# Patient Record
Sex: Male | Born: 1965 | Race: Black or African American | Hispanic: No | Marital: Single | Smoking: Never smoker
Health system: Southern US, Community
[De-identification: ages and names within clinical notes are randomized; demographics above are authoritative.]

## PROBLEM LIST (undated history)

## (undated) DIAGNOSIS — I472 Ventricular tachycardia, unspecified: Secondary | ICD-10-CM

## (undated) DIAGNOSIS — I428 Other cardiomyopathies: Secondary | ICD-10-CM

## (undated) DIAGNOSIS — F1010 Alcohol abuse, uncomplicated: Secondary | ICD-10-CM

## (undated) DIAGNOSIS — I447 Left bundle-branch block, unspecified: Secondary | ICD-10-CM

## (undated) DIAGNOSIS — R55 Syncope and collapse: Secondary | ICD-10-CM

## (undated) DIAGNOSIS — I509 Heart failure, unspecified: Secondary | ICD-10-CM

## (undated) HISTORY — PX: OTHER SURGICAL HISTORY: SHX169

## (undated) HISTORY — DX: Alcohol abuse, uncomplicated: F10.10

## (undated) HISTORY — PX: IMPLANTABLE CARDIOVERTER DEFIBRILLATOR IMPLANT: SHX5860

## (undated) HISTORY — DX: Other cardiomyopathies: I42.8

## (undated) HISTORY — DX: Syncope and collapse: R55

## (undated) HISTORY — PX: PACEMAKER PLACEMENT: SHX43

## (undated) HISTORY — DX: Heart failure, unspecified: I50.9

## (undated) HISTORY — DX: Ventricular tachycardia: I47.2

## (undated) HISTORY — DX: Ventricular tachycardia, unspecified: I47.20

## (undated) HISTORY — DX: Left bundle-branch block, unspecified: I44.7

---

## 1999-05-23 ENCOUNTER — Encounter: Admission: RE | Admit: 1999-05-23 | Discharge: 1999-05-23 | Payer: Self-pay | Admitting: Internal Medicine

## 2000-10-31 ENCOUNTER — Emergency Department (HOSPITAL_COMMUNITY): Admission: EM | Admit: 2000-10-31 | Discharge: 2000-10-31 | Payer: Self-pay | Admitting: Emergency Medicine

## 2000-10-31 ENCOUNTER — Encounter: Payer: Self-pay | Admitting: Emergency Medicine

## 2000-11-08 ENCOUNTER — Ambulatory Visit (HOSPITAL_BASED_OUTPATIENT_CLINIC_OR_DEPARTMENT_OTHER): Admission: RE | Admit: 2000-11-08 | Discharge: 2000-11-08 | Payer: Self-pay | Admitting: Orthopedic Surgery

## 2000-11-12 ENCOUNTER — Ambulatory Visit (HOSPITAL_BASED_OUTPATIENT_CLINIC_OR_DEPARTMENT_OTHER): Admission: RE | Admit: 2000-11-12 | Discharge: 2000-11-12 | Payer: Self-pay | Admitting: Otolaryngology

## 2000-11-16 ENCOUNTER — Encounter: Admission: RE | Admit: 2000-11-16 | Discharge: 2001-02-14 | Payer: Self-pay | Admitting: Orthopedic Surgery

## 2002-12-15 ENCOUNTER — Emergency Department (HOSPITAL_COMMUNITY): Admission: EM | Admit: 2002-12-15 | Discharge: 2002-12-15 | Payer: Self-pay | Admitting: Cardiology

## 2003-02-26 ENCOUNTER — Emergency Department (HOSPITAL_COMMUNITY): Admission: EM | Admit: 2003-02-26 | Discharge: 2003-02-26 | Payer: Self-pay | Admitting: Emergency Medicine

## 2004-12-19 ENCOUNTER — Emergency Department (HOSPITAL_COMMUNITY): Admission: EM | Admit: 2004-12-19 | Discharge: 2004-12-19 | Payer: Self-pay | Admitting: Emergency Medicine

## 2008-10-15 ENCOUNTER — Ambulatory Visit: Payer: Self-pay | Admitting: Internal Medicine

## 2008-10-15 ENCOUNTER — Inpatient Hospital Stay (HOSPITAL_COMMUNITY): Admission: EM | Admit: 2008-10-15 | Discharge: 2008-10-25 | Payer: Self-pay | Admitting: Emergency Medicine

## 2008-10-15 ENCOUNTER — Emergency Department (HOSPITAL_COMMUNITY): Admission: EM | Admit: 2008-10-15 | Discharge: 2008-10-15 | Payer: Self-pay | Admitting: Family Medicine

## 2008-10-16 ENCOUNTER — Encounter: Payer: Self-pay | Admitting: Internal Medicine

## 2008-10-16 ENCOUNTER — Ambulatory Visit: Payer: Self-pay | Admitting: Cardiovascular Disease

## 2008-10-18 ENCOUNTER — Encounter: Payer: Self-pay | Admitting: Internal Medicine

## 2008-10-23 ENCOUNTER — Encounter: Payer: Self-pay | Admitting: Internal Medicine

## 2008-10-24 ENCOUNTER — Encounter: Payer: Self-pay | Admitting: Internal Medicine

## 2008-10-26 ENCOUNTER — Encounter (INDEPENDENT_AMBULATORY_CARE_PROVIDER_SITE_OTHER): Payer: Self-pay | Admitting: Cardiology

## 2008-10-26 ENCOUNTER — Ambulatory Visit: Payer: Self-pay | Admitting: Cardiology

## 2008-10-26 ENCOUNTER — Telehealth: Payer: Self-pay | Admitting: Internal Medicine

## 2008-10-26 ENCOUNTER — Telehealth (INDEPENDENT_AMBULATORY_CARE_PROVIDER_SITE_OTHER): Payer: Self-pay | Admitting: *Deleted

## 2008-10-30 ENCOUNTER — Telehealth: Payer: Self-pay | Admitting: Internal Medicine

## 2008-10-31 DIAGNOSIS — F1010 Alcohol abuse, uncomplicated: Secondary | ICD-10-CM

## 2008-11-01 ENCOUNTER — Ambulatory Visit: Payer: Self-pay | Admitting: Internal Medicine

## 2008-11-01 ENCOUNTER — Encounter: Payer: Self-pay | Admitting: Nurse Practitioner

## 2008-11-01 DIAGNOSIS — I428 Other cardiomyopathies: Secondary | ICD-10-CM | POA: Insufficient documentation

## 2008-11-01 DIAGNOSIS — I5022 Chronic systolic (congestive) heart failure: Secondary | ICD-10-CM

## 2008-11-01 DIAGNOSIS — I472 Ventricular tachycardia, unspecified: Secondary | ICD-10-CM | POA: Insufficient documentation

## 2008-11-01 DIAGNOSIS — M109 Gout, unspecified: Secondary | ICD-10-CM

## 2008-11-01 LAB — CONVERTED CEMR LAB
POC INR: 2
Prothrombin Time: 17.5 s

## 2008-11-12 ENCOUNTER — Telehealth (INDEPENDENT_AMBULATORY_CARE_PROVIDER_SITE_OTHER): Payer: Self-pay | Admitting: *Deleted

## 2008-11-12 ENCOUNTER — Ambulatory Visit: Payer: Self-pay | Admitting: Cardiovascular Disease

## 2008-11-12 LAB — CONVERTED CEMR LAB
POC INR: 1.6
Prothrombin Time: 15.6 s

## 2008-11-13 ENCOUNTER — Telehealth: Payer: Self-pay | Admitting: Internal Medicine

## 2008-11-14 ENCOUNTER — Telehealth (INDEPENDENT_AMBULATORY_CARE_PROVIDER_SITE_OTHER): Payer: Self-pay | Admitting: *Deleted

## 2008-11-14 ENCOUNTER — Telehealth: Payer: Self-pay | Admitting: Internal Medicine

## 2008-11-19 ENCOUNTER — Ambulatory Visit: Payer: Self-pay | Admitting: Cardiovascular Disease

## 2008-12-04 ENCOUNTER — Ambulatory Visit: Payer: Self-pay | Admitting: Internal Medicine

## 2008-12-10 ENCOUNTER — Encounter (INDEPENDENT_AMBULATORY_CARE_PROVIDER_SITE_OTHER): Payer: Self-pay | Admitting: *Deleted

## 2008-12-10 ENCOUNTER — Ambulatory Visit: Payer: Self-pay | Admitting: Internal Medicine

## 2008-12-12 ENCOUNTER — Telehealth: Payer: Self-pay | Admitting: Internal Medicine

## 2009-01-11 ENCOUNTER — Encounter (INDEPENDENT_AMBULATORY_CARE_PROVIDER_SITE_OTHER): Payer: Self-pay | Admitting: *Deleted

## 2009-01-16 ENCOUNTER — Encounter (INDEPENDENT_AMBULATORY_CARE_PROVIDER_SITE_OTHER): Payer: Self-pay | Admitting: *Deleted

## 2009-02-12 ENCOUNTER — Encounter: Payer: Self-pay | Admitting: Cardiology

## 2009-03-01 ENCOUNTER — Ambulatory Visit: Payer: Self-pay | Admitting: Internal Medicine

## 2009-03-01 DIAGNOSIS — I4891 Unspecified atrial fibrillation: Secondary | ICD-10-CM

## 2009-03-01 DIAGNOSIS — I10 Essential (primary) hypertension: Secondary | ICD-10-CM

## 2009-04-23 ENCOUNTER — Telehealth: Payer: Self-pay | Admitting: Internal Medicine

## 2009-04-23 ENCOUNTER — Emergency Department (HOSPITAL_COMMUNITY): Admission: EM | Admit: 2009-04-23 | Discharge: 2009-04-23 | Payer: Self-pay | Admitting: Internal Medicine

## 2009-05-31 ENCOUNTER — Ambulatory Visit: Payer: Self-pay | Admitting: Internal Medicine

## 2009-06-12 LAB — CONVERTED CEMR LAB
ALT: 37 units/L (ref 0–53)
Alkaline Phosphatase: 54 units/L (ref 39–117)
Bilirubin, Direct: 0 mg/dL (ref 0.0–0.3)
Total Protein: 7.4 g/dL (ref 6.0–8.3)

## 2009-06-17 ENCOUNTER — Ambulatory Visit: Payer: Self-pay | Admitting: Internal Medicine

## 2009-07-08 ENCOUNTER — Telehealth: Payer: Self-pay | Admitting: Internal Medicine

## 2009-09-20 ENCOUNTER — Telehealth: Payer: Self-pay | Admitting: Internal Medicine

## 2009-10-23 ENCOUNTER — Ambulatory Visit: Payer: Self-pay | Admitting: Internal Medicine

## 2009-10-31 LAB — CONVERTED CEMR LAB
ALT: 43 units/L (ref 0–53)
AST: 34 units/L (ref 0–37)
Bilirubin, Direct: 0.1 mg/dL (ref 0.0–0.3)
Free T4: 0.67 ng/dL (ref 0.60–1.60)
Total Bilirubin: 0.6 mg/dL (ref 0.3–1.2)

## 2009-11-13 ENCOUNTER — Telehealth: Payer: Self-pay | Admitting: Internal Medicine

## 2009-11-18 ENCOUNTER — Telehealth (INDEPENDENT_AMBULATORY_CARE_PROVIDER_SITE_OTHER): Payer: Self-pay | Admitting: *Deleted

## 2009-11-29 ENCOUNTER — Telehealth (INDEPENDENT_AMBULATORY_CARE_PROVIDER_SITE_OTHER): Payer: Self-pay | Admitting: *Deleted

## 2010-01-14 ENCOUNTER — Encounter (INDEPENDENT_AMBULATORY_CARE_PROVIDER_SITE_OTHER): Payer: Self-pay | Admitting: *Deleted

## 2010-01-24 ENCOUNTER — Telehealth: Payer: Self-pay | Admitting: Internal Medicine

## 2010-01-29 ENCOUNTER — Ambulatory Visit: Payer: Self-pay | Admitting: Internal Medicine

## 2010-01-30 ENCOUNTER — Encounter: Payer: Self-pay | Admitting: Internal Medicine

## 2010-05-05 ENCOUNTER — Encounter: Payer: Self-pay | Admitting: Internal Medicine

## 2010-05-05 ENCOUNTER — Ambulatory Visit: Admission: RE | Admit: 2010-05-05 | Discharge: 2010-05-05 | Payer: Self-pay | Source: Home / Self Care

## 2010-05-29 NOTE — Progress Notes (Signed)
Summary: Patient signed ROI. Copies given to patient. :)  Patient signed ROI. Papers given to patient.  [name] Harrington  November 18, 2009 4:16 PM

## 2010-05-29 NOTE — Letter (Signed)
Summary: Appointment - Missed  [facility], Main Office  [address]   [place], [place] [address]   Phone: [phone]  Fax: [phone]     January 14, 2010 MRN: [id number]   [facility] [address] [place], [place]  [address]   Dear [name] Harrington,  Our records indicate you missed your appointment on  12/12/2009 at 09:00am with Dr. Gala Romney. It is very important that we reach you to reschedule this appointment. We look forward to participating in your health care needs. Please contact us at the number listed above at your earliest convenience to reschedule this appointment.     Sincerely,  Neurosurgeon Team  GD

## 2010-05-29 NOTE — Progress Notes (Signed)
Summary: lab work  Phone Note Call from Patient Call back at [employer] [phone]   Caller: Patient Reason for Call: Talk to Nurse Summary of Call: still wanting on what he should do about his lab work, has not heard anything since he talked to Dr Johney Frame, states he was to call back Initial call taken by: Migdalia Dk,  July 08, 2009 3:23 PM  Follow-up for Phone Call        PER  PT WAS SUPPOSE TO HEAR FROM DR AVBUERE OFF WITH APPT AT DR Amedeo Plenty REQUEST PT NEEDS PMD .INSTRUCTED WILL CALL OFF AND HAVE THME CALL PT WITH APPT. Follow-up by: Scherrie Bateman, LPN,  July 08, 2009 3:38 PM  Additional Follow-up for Phone Call Additional follow up Details #1::        Phone Call Completed PT HAS APPT WITH DR AVBUERE PER PT. Additional Follow-up by: Scherrie Bateman, LPN,  July 08, 2009 4:08 PM

## 2010-05-29 NOTE — Progress Notes (Signed)
  DDS Request Recieved sent to [facility]. [facility]  November 29, 2009 8:10 AM

## 2010-05-29 NOTE — Progress Notes (Signed)
Summary: chest pain & tenderness   Phone Note From Other Clinic Call back at Home Phone [phone]   Caller: Referral Coordinator Summary of Call: Per [facility]. pt was seen today in there clinic and c/o chest pain and tenderness near his pacer site.  Initial call taken by: Edman Circle,  January 24, 2010 10:35 AM  Follow-up for Phone Call        will have pt come in and have his device checked in device clinic on 01/29/10.  Sme day Dr Johney Frame is here so if DrAllred needs to address anythisn he can.  Pt aware of time of apt. Dennis Bast, RN, BSN  January 24, 2010 3:44 PM

## 2010-05-29 NOTE — Procedures (Signed)
Summary: device/saf   Current Medications (verified): 1)  Allopurinol 300 Mg Tabs (Allopurinol) .... Take One Tablet By Mouth Once Daily. 2)  Prilosec 20 Mg Cpdr (Omeprazole) .... As Needed 3)  Carvedilol 6.25 Mg Tabs (Carvedilol) .... One By Mouth Bid 4)  Diovan 40 Mg Tabs (Valsartan) .... Take One Tablet By Mouth Two Times A Day  Allergies (verified): 1)  ! Ace Inhibitors   ICD Specifications Following MD:  Hillis Range, MD     ICD Vendor:  [employer] Jude     ICD Model Number:  ZO1096-04     ICD Serial Number:  [id number] ICD DOI:  10/22/2008     ICD Implanting MD:  Hillis Range, MD  Lead 1:    Location: RA     DOI: 10/22/2008     Model #: [id number]     Serial #: [id number]     Status: active Lead 2:    Location: RV     DOI: 10/22/2008     Model #: [id number]     Serial #: [id number]     Status: active  Indications::  VT   ICD Follow Up Battery Voltage:  3.19 V     Charge Time:  10.9 seconds     Battery Est. Longevity:  6.3-7.0 yrs Underlying rhythm:  SR ICD Dependent:  No       ICD Device Measurements Atrium:  Amplitude: 3.3 mV, Impedance: 430 ohms, Threshold: 0.75 V at 0.5 msec Right Ventricle:  Amplitude: 12.0 mV, Impedance: 410 ohms, Threshold: 0.75 V at 0.5 msec Shock Impedance: 48 ohms   Episodes MS Episodes:  890     Percent Mode Switch:  <1%     Coumadin:  No Shock:  0     ATP:  0     Nonsustained:  0     Atrial Therapies:  0 Atrial Pacing:  4.7%     Ventricular Pacing:  <1%  Brady Parameters Mode DDI     Lower Rate Limit:  50     PAV 350      Tachy Zones VF:  222     VT:  173     Next Cardiology Appt Due:  06/19/2010 Tech Comments:  890 AT/AF EPISODES--SOME DUE TO FFRW BUT SOME REAL.  LONGEST EPISODE 2 HRS 16 MINUTES.  PER PT AMIODARONE D/C ABOUT 1 YR AGO BUT LAST OV W/JA IN JUNE 2011 PT TO BE ON AMIODARONE.  PER JA TURN OFF AUTO SENSITIVITY.  CHANGED RA SENSITIVITY TO 0.5.  PT SCHEDULED TO SEE JA 06-19-10 FOR FOLLOWUP. Vella Kohler  May 05, 2010 9:26 AM

## 2010-05-29 NOTE — Cardiovascular Report (Signed)
Summary: Office Visit   Office Visit   Imported By: Roderic Ovens 02/03/2010 11:16:26  _____________________________________________________________________  External Attachment:    Type:   Image     Comment:   External Document

## 2010-05-29 NOTE — Cardiovascular Report (Signed)
Summary: Office Visit   Office Visit   Imported By: Roderic Ovens 06/04/2009 10:37:10  _____________________________________________________________________  External Attachment:    Type:   Image     Comment:   External Document

## 2010-05-29 NOTE — Assessment & Plan Note (Signed)
Summary: per check out/sf   Visit Type:  Follow-up Primary Provider:  Dolores Patty, MD, [facility]  CC:  Severe tooth pain.  History of Present Illness: The patient presents today for routine electrophysiology followup. He reports doing very well since last being seen in our clinic.  His primary concern today is R lower jaw tootache. The patient denies symptoms of palpitations, chest pain, shortness of breath, orthopnea, PND, lower extremity edema, dizziness, presyncope, syncope, or neurologic sequela.  Unfortunatley, he continues to drink ETOH against our recommendation. The patient is tolerating medications without difficulties and is otherwise without complaint today.   Current Medications (verified): 1)  Amiodarone Hcl 200 Mg Tabs (Amiodarone Hcl) .... Take 1/2 Tablet By Mouth Once A Day 2)  Allopurinol 300 Mg Tabs (Allopurinol) .... Take One Tablet By Mouth Once Daily. 3)  Prilosec 20 Mg Cpdr (Omeprazole) .... As Needed 4)  Coreg 3.125 Mg Tabs (Carvedilol) .Marland Kitchen.. 1 Tab By Mouth Twice A Day 5)  Diovan 40 Mg Tabs (Valsartan) .... Take One Tablet By Mouth Two Times A Day  Allergies (verified): 1)  ! Ace Inhibitors  Past History:  Past Medical History: Reviewed history from 03/01/2009 and no changes required. ALCOHOL ABUSE (ICD-305.00) SLOW VENTRICULAR TACHYCARDIA      a. cath 10/22/2008 - NL Cors. EF 25%      b. s/p St. Jude Current + DR dual chamber AICD 10/22/2008 NICM/CHRONIC SYST. CHF      a. 09/2008 echo: EF 25% PALPITATIONS/ Afib PRE-SYNCOPE GOUT LBBB   Past Surgical History: Reviewed history from 10/31/2008 and no changes required. Proc. Date: 11/12/00 SURGEON:  Zola Button T. Lazarus Salines, M.D. PROCEDURE:  Closed reduction nasal fracture, with internal and external stabilization.  Proc. Date: 11/08/00 SURGEON:  Molli Hazard A. Mina Marble, M.D. PROCEDURE:  Open reduction internal fixation of above fracture using mini fragment lag screw fixation - two 2.7 x 12 mm screws and one 2.0 x  10 mm screw.  Social History: Reviewed history from 03/01/2009 and no changes required.   Works at [employer].  He lives with his fiancee in   [place].  Denies any tobacco but does drink alcohol as described above.  Denies drug use.      Review of Systems       All systems are reviewed and negative except as listed in the HPI.   Vital Signs:  Patient profile:   45 year old male Height:      70.5 inches Weight:      226 pounds BMI:     32.08 Pulse rate:   65 / minute BP sitting:   176 / 108  (left arm)  Vitals Entered By: Laurance Flatten CMA (October 23, 2009 10:25 AM)  Physical Exam  General:  Well developed, well nourished, in no acute distress. Head:  normocephalic and atraumatic Mouth:  poor dentision without obvious abscess Neck:  Neck supple, no JVD. No masses, thyromegaly or abnormal cervical nodes. Chest Wall:  ICD pocket well healed Lungs:  Clear bilaterally to auscultation and percussion. Heart:  Non-displaced PMI, chest non-tender; regular rate and rhythm, S1, S2 without murmurs, rubs or gallops. Carotid upstroke normal, no bruit. Normal abdominal aortic size, no bruits. Femorals normal pulses, no bruits. Pedals normal pulses. No edema, no varicosities. Abdomen:  Bowel sounds positive; abdomen soft and non-tender without masses, organomegaly, or hernias noted. No hepatosplenomegaly. Msk:  Back normal, normal gait. Muscle strength and tone normal. Pulses:  pulses normal in all 4 extremities Extremities:  No clubbing or cyanosis.  Neurologic:  Alert and oriented x 3. Skin:  Intact without lesions or rashes. Psych:  Normal affect.    ICD Specifications Following MD:  Hillis Range, MD     ICD Vendor:  [facility] Jude     ICD Model Number:  [id number]     ICD Serial Number:  [id number] ICD DOI:  10/22/2008     ICD Implanting MD:  Hillis Range, MD  Lead 1:    Location: RA     DOI: 10/22/2008     Model #: [id number]     Serial #: [id number]     Status: active Lead 2:    Location: RV     DOI:  10/22/2008     Model #: [id number]     Serial #: [id number]     Status: active  Indications::  VT   ICD Follow Up Remote Check?  No Battery Voltage:  3.20 V     Charge Time:  7.4 seconds     Underlying rhythm:  SR ICD Dependent:  No       ICD Device Measurements Atrium:  Amplitude: 3.0 mV, Impedance: 450 ohms, Threshold: 0.75 V at 0.5 msec Right Ventricle:  Amplitude: 12 mV, Impedance: 480 ohms, Threshold: 0.75 V at 0.5 msec Shock Impedance: 50 ohms   Episodes MS Episodes:  51     Percent Mode Switch:  <1%     Coumadin:  No Shock:  0     ATP:  0     Nonsustained:  0     Atrial Pacing:  3.3%     Ventricular Pacing:  <1%  Brady Parameters Mode DDI     Lower Rate Limit:  50     PAV 350      Tachy Zones VF:  222     VT:  173     Next Remote Date:  01/23/2010     Next Cardiology Appt Due:  09/26/2010 Tech Comments:  Normal device function.  No changes made today. Longest mode switch episode 10 minutes. Majority of episodes are FFRW that occur before VS events.  No way to program around this except for atrial sensitivity, opted not to do this because other mode switch episodes have atrial undersensing.  Will enroll patient in [facility].  ROV 12 months JA. Gypsy Balsam, RN, BSN MD Comments:  agree  Impression & Recommendations:  Problem # 1:  CHRONIC SYSTOLIC HEART FAILURE (ICD-428.22)  stable NYHA Class II CHF. ETOH cessation discussed at length increase coreg  Orders: TLB-TSH (Thyroid Stimulating Hormone) (84443-TSH) TLB-T4 (Thyrox), Free [phone]) TLB-Hepatic/Liver Function Pnl (80076-HEPATIC)  Problem # 2:  ESSENTIAL HYPERTENSION, BENIGN (ICD-401.1)  above goal increase coreg  His updated medication list for this problem includes:    Carvedilol 6.25 Mg Tabs (Carvedilol) ..... One by mouth bid    Diovan 40 Mg Tabs (Valsartan) .Marland Kitchen... Take one tablet by mouth two times a day  Orders: TLB-TSH (Thyroid Stimulating Hormone) (84443-TSH) TLB-T4 (Thyrox), Free  [phone]) TLB-Hepatic/Liver Function Pnl (80076-HEPATIC)  Problem # 3:  VENTRICULAR TACHYCARDIA (ICD-427.1)  controlled with amiodarone we will check LFTs and TFTs today normal ICD function pt instructed to have urgent follow-up with dentist for poor dentition to avoid bactermia/ ICD system infection longterm  Orders: TLB-TSH (Thyroid Stimulating Hormone) (84443-TSH) TLB-T4 (Thyrox), Free [phone]) TLB-Hepatic/Liver Function Pnl (80076-HEPATIC)  Problem # 4:  ATRIAL FIBRILLATION (ICD-427.31) stable on amiodarone increase coreg as tolerated for rate control ETOH cessation consider restarting coumadin if his afib burden increases  Problem #  5:  ALCOHOL ABUSE (ICD-305.00)  cessation advised  Orders: TLB-TSH (Thyroid Stimulating Hormone) (84443-TSH) TLB-T4 (Thyrox), Free [phone]) TLB-Hepatic/Liver Function Pnl (80076-HEPATIC)  Patient Instructions: 1)  Your physician recommends that you schedule a follow-up appointment in: 12 months with Dr Johney Frame, needs next avaliable with Dr Gala Romney and needs to get an appoinment with his dentist ASAP 2)  Your physician recommends that you return for lab work today 3)  Your physician has recommended you make the following change in your medication: increase Coreg to 6.25mg  two times a day( take 2 of the 3.125 mg tablets twice daily) until you run out then get new  prescription filled 4)  Watch the alcohol Prescriptions: CARVEDILOL 6.25 MG TABS (CARVEDILOL) one by mouth bid  #60 x 6   Entered by:   Dennis Bast, RN, BSN   Authorized by:   Hillis Range, MD   Signed by:   Dennis Bast, RN, BSN on 10/23/2009   Method used:   Electron[facility] to        [facility]  [address] [phone][facility] (retail)       [address]       [place], [place]  [id number]       Ph: [phone] or [phone]       Fax: [phone]   RxID:   [id number]

## 2010-05-29 NOTE — Procedures (Signed)
Summary: df2   Current Medications (verified): 1)  Amiodarone Hcl 200 Mg Tabs (Amiodarone Hcl) .... Take 1/2 Tablet By Mouth Once A Day 2)  Allopurinol 300 Mg Tabs (Allopurinol) .... Take One Tablet By Mouth Once Daily. 3)  Prilosec 20 Mg Cpdr (Omeprazole) .... As Needed 4)  Carvedilol 6.25 Mg Tabs (Carvedilol) .... One By Mouth Bid 5)  Diovan 40 Mg Tabs (Valsartan) .... Take One Tablet By Mouth Two Times A Day  Allergies (verified): 1)  ! Ace Inhibitors   ICD Specifications Following MD:  Hillis Range, MD     ICD Vendor:  St Jude     ICD Model Number:  [id number]     ICD Serial Number:  [id number] ICD DOI:  10/22/2008     ICD Implanting MD:  Hillis Range, MD  Lead 1:    Location: RA     DOI: 10/22/2008     Model #: [id number]     Serial #: [id number]     Status: active Lead 2:    Location: RV     DOI: 10/22/2008     Model #: [id number]     Serial #: [id number]     Status: active  Indications::  VT   ICD Follow Up Battery Voltage:  3.19 V     Charge Time:  10.9 seconds     Battery Est. Longevity:  6.4-7.0 yrs Underlying rhythm:  SR ICD Dependent:  No       ICD Device Measurements Atrium:  Amplitude: 3.8 mV, Impedance: 450 ohms,  Right Ventricle:  Amplitude: 12.0 mV, Impedance: 480 ohms, Threshold: 1.0 V at 0.5 msec Shock Impedance: 53 ohms   Episodes MS Episodes:  497     Percent Mode Switch:  <1%     Coumadin:  No Shock:  0     ATP:  0     Nonsustained:  0     Atrial Therapies:  0 Atrial Pacing:  7.5%     Ventricular Pacing:  <1%  Brady Parameters Mode DDI     Lower Rate Limit:  50     PAV 350      Tachy Zones VF:  222     VT:  173     Next Cardiology Appt Due:  04/28/2010 Tech Comments:  PT COMPLAINING OF PAIN OR IRRITATION AROUND DEVICE SITE. PT FELL ABOUT 2 MTHS AGO AND STARTED NOTICING PAIN.  NORMAL DEVICE FUNCTION.  NO CHANGES MADE. ROV IN 3 MTHS W/DEVICE CLINIC. Vella Kohler  January 29, 2010 10:07 AM

## 2010-05-29 NOTE — Cardiovascular Report (Signed)
Summary: Office Visit   Office Visit   Imported By: Roderic Ovens 05/19/2010 10:54:11  _____________________________________________________________________  External Attachment:    Type:   Image     Comment:   External Document

## 2010-05-29 NOTE — Letter (Signed)
Summary: Device-Delinquent Phone Journalist, newspaper, Main Office  [address]   [place], [place] [address]   Phone: [phone]  Fax: [phone]     January 30, 2010 MRN: [id number]   [facility] [address] [place], [place]  [address]   Dear [name] Harrington,  According to our records, you were scheduled for a device phone transmission on 01-23-10.     We did not receive any results from this check.  If you transmitted on your scheduled day, please call us to help troubleshoot your system.  If you forgot to send your transmission, please send one upon receipt of this letter.  Thank you,   Architectural technologist Device Clinic

## 2010-05-29 NOTE — Assessment & Plan Note (Signed)
Summary: PC2 CHECK   Visit Type:  Follow-up Primary Provider:  Dolores Patty, MD, [facility]   History of Present Illness: The patient presents today for routine electrophysiology followup. He reports doing very well since last being seen in our clinic. The patient denies symptoms of palpitations, chest pain, shortness of breath, orthopnea, PND, lower extremity edema, dizziness, presyncope, syncope, or neurologic sequela. The patient is tolerating medications without difficulties and is otherwise without complaint today.   Current Medications (verified): 1)  Amiodarone Hcl 200 Mg Tabs (Amiodarone Hcl) .... Take 1 Tablet By Mouth Once A Day 2)  Allopurinol 100 Mg Tabs (Allopurinol) .Marland Kitchen.. 1 Tab By Mouth Daily. 3)  Prilosec 20 Mg Cpdr (Omeprazole) .... As Needed 4)  Coreg 3.125 Mg Tabs (Carvedilol) .Marland Kitchen.. 1 Tab By Mouth Twice A Day 5)  Diovan 40 Mg Tabs (Valsartan) .... Take One Tablet By Mouth Twice A Day  Allergies: 1)  ! Ace Inhibitors  Past History:  Past Medical History: Reviewed history from 03/01/2009 and no changes required. ALCOHOL ABUSE (ICD-305.00) SLOW VENTRICULAR TACHYCARDIA      a. cath 10/22/2008 - NL Cors. EF 25%      b. s/p St. Jude Current + DR dual chamber AICD 10/22/2008 NICM/CHRONIC SYST. CHF      a. 09/2008 echo: EF 25% PALPITATIONS/ Afib PRE-SYNCOPE GOUT LBBB   Past Surgical History: Reviewed history from 10/31/2008 and no changes required. Proc. Date: 11/12/00 SURGEON:  Zola Button T. Lazarus Salines, M.D. PROCEDURE:  Closed reduction nasal fracture, with internal and external stabilization.  Proc. Date: 11/08/00 SURGEON:  Molli Hazard A. Mina Marble, M.D. PROCEDURE:  Open reduction internal fixation of above fracture using mini fragment lag screw fixation - two 2.7 x 12 mm screws and one 2.0 x 10 mm screw.  Social History: Reviewed history from 03/01/2009 and no changes required.   Works at [employer].  He lives with his fiancee in   [place].  Denies any tobacco but does  drink alcohol as described above.  Denies drug use.      Review of Systems       All systems are reviewed and negative except as listed in the HPI.   Vital Signs:  Patient profile:   45 year old male Height:      70.5 inches Weight:      222 pounds BMI:     31.52 Pulse rate:   60 / minute BP sitting:   150 / 90  Vitals Entered By: Laurance Flatten CMA (May 31, 2009 9:38 AM)  Physical Exam  General:  Well developed, well nourished, in no acute distress. Head:  normocephalic and atraumatic Eyes:  PERRLA/EOM intact; conjunctiva and lids normal. Mouth:  Teeth, gums and palate normal. Oral mucosa normal. Neck:  Neck supple, no JVD. No masses, thyromegaly or abnormal cervical nodes. Chest Wall:  ICD pocket well healed Lungs:  Clear bilaterally to auscultation and percussion. Heart:  Non-displaced PMI, chest non-tender; regular rate and rhythm, S1, S2 without murmurs, rubs or gallops. Carotid upstroke normal, no bruit. Normal abdominal aortic size, no bruits. Femorals normal pulses, no bruits. Pedals normal pulses. No edema, no varicosities. Abdomen:  Bowel sounds positive; abdomen soft and non-tender without masses, organomegaly, or hernias noted. No hepatosplenomegaly. Msk:  Back normal, normal gait. Muscle strength and tone normal. Pulses:  pulses normal in all 4 extremities Extremities:  No clubbing or cyanosis. Neurologic:  Alert and oriented x 3. Skin:  Intact without lesions or rashes. Cervical Nodes:  no significant adenopathy Psych:  Normal affect.    ICD Specifications Following MD:  Hillis Range, MD     ICD Vendor:  [facility] Jude     ICD Model Number:  [id number]     ICD Serial Number:  [id number] ICD DOI:  10/22/2008     ICD Implanting MD:  Hillis Range, MD  Lead 1:    Location: RA     DOI: 10/22/2008     Model #: [id number]     Serial #: [id number]     Status: active Lead 2:    Location: RV     DOI: 10/22/2008     Model #: 5621     Serial #: [id number]     Status:  active  Indications::  VT   ICD Follow Up Remote Check?  No Battery Voltage:  3.20 V     Charge Time:  10.9 seconds     Battery Est. Longevity:  6.8 years Underlying rhythm:  SR ICD Dependent:  No       ICD Device Measurements Atrium:  Amplitude: 2.3 mV, Impedance: 480 ohms, Threshold: 0.7 V at 0.5 msec Right Ventricle:  Amplitude: 12 mV, Impedance: 480 ohms, Threshold: 0.7 V at 0.5 msec Shock Impedance: 52 ohms   Episodes MS Episodes:  0     Percent Mode Switch:  0     Coumadin:  No Shock:  0     ATP:  0     Nonsustained:  0     Atrial Pacing:  5.6%     Ventricular Pacing:  <1%  Brady Parameters Mode DDI     Lower Rate Limit:  50     PAV 350      Tachy Zones VF:  222     VT:  173     Tech Comments:  Mode switch rate changed to 150.  ROV 3months with Dr. Johney Frame. Checked by [employer]. Altha Harm, LPN  May 31, 2009 10:00 AM  MD Comments:  NO further VT.  No PMT.  No Afib  Impression & Recommendations:  Problem # 1:  VENTRICULAR TACHYCARDIA (ICD-427.1)  Controlled with amiodarone. Decrease amiodarone to 100mg  daily. TFTs and LFTs today.  His updated medication list for this problem includes:    Amiodarone Hcl 200 Mg Tabs (Amiodarone hcl) .Marland Kitchen... Take 1/2 tablet by mouth once a day    Coreg 3.125 Mg Tabs (Carvedilol) .Marland Kitchen... 1 tab by mouth twice a day  His updated medication list for this problem includes:    Amiodarone Hcl 200 Mg Tabs (Amiodarone hcl) .Marland Kitchen... Take 1 tablet by mouth once a day    Coreg 3.125 Mg Tabs (Carvedilol) .Marland Kitchen... 1 tab by mouth twice a day  Problem # 2:  ATRIAL FIBRILLATION (ICD-427.31)  No further episodes. ETOH cessation advised as this is a likely cause.  His updated medication list for this problem includes:    Amiodarone Hcl 200 Mg Tabs (Amiodarone hcl) .Marland Kitchen... Take 1/2 tablet by mouth once a day    Coreg 3.125 Mg Tabs (Carvedilol) .Marland Kitchen... 1 tab by mouth twice a day  Orders: TLB-TSH (Thyroid Stimulating Hormone) (84443-TSH) TLB-Hepatic/Liver  Function Pnl (80076-HEPATIC)  Problem # 3:  ESSENTIAL HYPERTENSION, BENIGN (ICD-401.1) Increase diovan to 80mg  daily (presently only taking 40mg  daily)  Problem # 4:  CHRONIC SYSTOLIC HEART FAILURE (ICD-428.22) Assessment: Unchanged  Increase diovan as above  His updated medication list for this problem includes:    Amiodarone Hcl 200 Mg Tabs (Amiodarone hcl) .Marland Kitchen... Take 1/2 tablet by mouth once  a day    Coreg 3.125 Mg Tabs (Carvedilol) .Marland Kitchen... 1 tab by mouth twice a day    Diovan 80 Mg Tabs (Valsartan) .Marland Kitchen... Take one tablet by mouth daily  His updated medication list for this problem includes:    Amiodarone Hcl 200 Mg Tabs (Amiodarone hcl) .Marland Kitchen... Take 1 tablet by mouth once a day    Coreg 3.125 Mg Tabs (Carvedilol) .Marland Kitchen... 1 tab by mouth twice a day    Diovan 40 Mg Tabs (Valsartan) .Marland Kitchen... Take one tablet by mouth twice a day  Orders: TLB-TSH (Thyroid Stimulating Hormone) (84443-TSH) TLB-Hepatic/Liver Function Pnl (80076-HEPATIC)  Problem # 5:  GOUT (ICD-274.9) Pt instructed to obtain a PCP to manage ETOH avoidance Increase allopurinol to 200mg  daily  His updated medication list for this problem includes:    Allopurinol 100 Mg Tabs (Allopurinol) .Marland Kitchen... 1 tab by mouth two times a day  Patient Instructions: 1)  Your physician recommends that you schedule a follow-up appointment in: 3 months with Dr Johney Frame 2)  Your physician has recommended you make the following change in your medication: decrease Amiodarone to 100mg  daily, increase Allopurinol to 200mg  daily, increase Diovan to 80mg  daily watch ETOH consumption 3)  Continue to get primary MD 4)  Your physician recommends that you return for lab work today Prescriptions: DIOVAN 80 MG TABS (VALSARTAN) Take one tablet by mouth daily  #30 x 11   Entered by:   Dennis Bast, RN, BSN   Authorized by:   Hillis Range, MD   Signed by:   Dennis Bast, RN, BSN on 05/31/2009   Method used:   [facility]  [address]  [phone]* (retail)       [address]       [place], [place]  [id number]       Ph: [phone] or [phone]       Fax: [phone]   RxID:   [id number]

## 2010-05-29 NOTE — Progress Notes (Signed)
Summary: pt needs autherization/pls notify patient when done  Phone Note Refill Request Call back at Home Phone [phone] Message from:  [facility] [address]  Refills Requested: Medication #1:  carvedilol 3.125mg  bid pt needs autherization for new refill / please notify patient when you call it in  Initial call taken by: Omer Jack,  Sep 20, 2009 8:32 AM    Prescriptions: COREG 3.125 MG TABS (CARVEDILOL) 1 tab by mouth twice a day  #60 x 6   Entered by:   Laurance Flatten CMA   Authorized by:   Hillis Range, MD   Signed by:   Laurance Flatten CMA on 09/20/2009   Method used:   [facility]  [address] [phone][facility] (retail)       [address]       [place], [place]  [id number]       Ph: [phone] or [phone]       Fax: [phone]   RxID:   [id number]

## 2010-05-29 NOTE — Progress Notes (Signed)
Summary: refill request and questions re med  Phone Note Refill Request Message from:  Patient on November 13, 2009 8:14 AM  Refills Requested: Medication #1:  ALLOPURINOL 300 MG TABS Take one tablet by mouth once daily.  Medication #2:  CARVEDILOL 6.25 MG TABS one by mouth bid  Medication #3:  DIOVAN 40 MG TABS Take one tablet by mouth two times a day. pt lost his job and wants to get meds refilled while still has insurance coverage-also pt wants to know what he can do about getting heart meds in the future when he doesn't have insurance? [facility] [address]   Method Requested: Telephone to Pharmacy Initial call taken by: Glynda Jaeger,  November 13, 2009 8:20 AM  Follow-up for Phone Call        pt advised to call [facility] and get follow up there to get medications  Will call in his heart meds today but told him his insurance may not pay if it has been less than 30 days Dennis Bast, RN, BSN  November 13, 2009 8:51 AM    Prescriptions: DIOVAN 40 MG TABS (VALSARTAN) Take one tablet by mouth two times a day  #30 x 5   Entered by:   Laurance Flatten CMA   Authorized by:   Hillis Range, MD   Signed by:   Laurance Flatten CMA on 11/13/2009   Method used:   [facility]        [facility]  [address] [phone][facility] (retail)       [address]       [place], [place]  [address]       Ph: [phone] or [phone]       Fax: [phone]   RxID:   [id number] CARVEDILOL 6.25 MG TABS (CARVEDILOL) one by mouth bid  #60 x 5   Entered by:   Laurance Flatten CMA   Authorized by:   Hillis Range, MD   Signed by:   Laurance Flatten CMA on 11/13/2009   Method used:   [facility]        [facility]  [address] [phone]* (retail)       [address]       [place], [place]  [address]       Ph: [phone] or [phone]       Fax: [phone]   RxID:   [id number] AMIODARONE HCL 200 MG TABS (AMIODARONE HCL) Take 1/2 tablet by mouth once a day  #30 x  5   Entered by:   Laurance Flatten CMA   Authorized by:   Hillis Range, MD   Signed by:   Laurance Flatten CMA on 11/13/2009   Method used:   [facility]ically [facility]        [facility]  [address] [phone]* (retail)       [address]       [place], [place]  [id number]       Ph: [phone] or [phone]       Fax: [phone]   RxID:   [id number]

## 2010-06-05 ENCOUNTER — Encounter (INDEPENDENT_AMBULATORY_CARE_PROVIDER_SITE_OTHER): Payer: Self-pay | Admitting: Family Medicine

## 2010-06-05 LAB — CONVERTED CEMR LAB
Albumin: 4.4 g/dL (ref 3.5–5.2)
Alkaline Phosphatase: 82 units/L (ref 39–117)
BUN: 18 mg/dL (ref 6–23)
CO2: 28 meq/L (ref 19–32)
Cholesterol: 196 mg/dL (ref 0–200)
Glucose, Bld: 87 mg/dL (ref 70–99)
HDL: 49 mg/dL (ref >39)
LDL Cholesterol: 75 mg/dL (ref 0–99)
Potassium: 4.7 meq/L (ref 3.5–5.3)
Total Bilirubin: 0.5 mg/dL (ref 0.3–1.2)
Triglycerides: 358 mg/dL — ABNORMAL HIGH (ref <150)

## 2010-06-19 ENCOUNTER — Encounter: Payer: Self-pay | Admitting: Internal Medicine

## 2010-06-30 ENCOUNTER — Encounter (INDEPENDENT_AMBULATORY_CARE_PROVIDER_SITE_OTHER): Payer: Self-pay | Admitting: Internal Medicine

## 2010-06-30 ENCOUNTER — Encounter: Payer: Self-pay | Admitting: Internal Medicine

## 2010-06-30 ENCOUNTER — Other Ambulatory Visit: Payer: Self-pay | Admitting: Internal Medicine

## 2010-06-30 DIAGNOSIS — I472 Ventricular tachycardia: Secondary | ICD-10-CM

## 2010-06-30 DIAGNOSIS — I5022 Chronic systolic (congestive) heart failure: Secondary | ICD-10-CM

## 2010-06-30 DIAGNOSIS — I4891 Unspecified atrial fibrillation: Secondary | ICD-10-CM

## 2010-06-30 LAB — TSH: TSH: 3.15 u[IU]/mL (ref 0.35–5.50)

## 2010-06-30 LAB — T4, FREE: Free T4: 0.63 ng/dL (ref 0.60–1.60)

## 2010-07-07 IMAGING — CR DG CHEST 1V PORT
1 series · 1 of 1 positions shown · non-contrast
Comparison: None

CLINICAL DATA: Palpitations

PORTABLE CHEST - 1 VIEW

[AP]
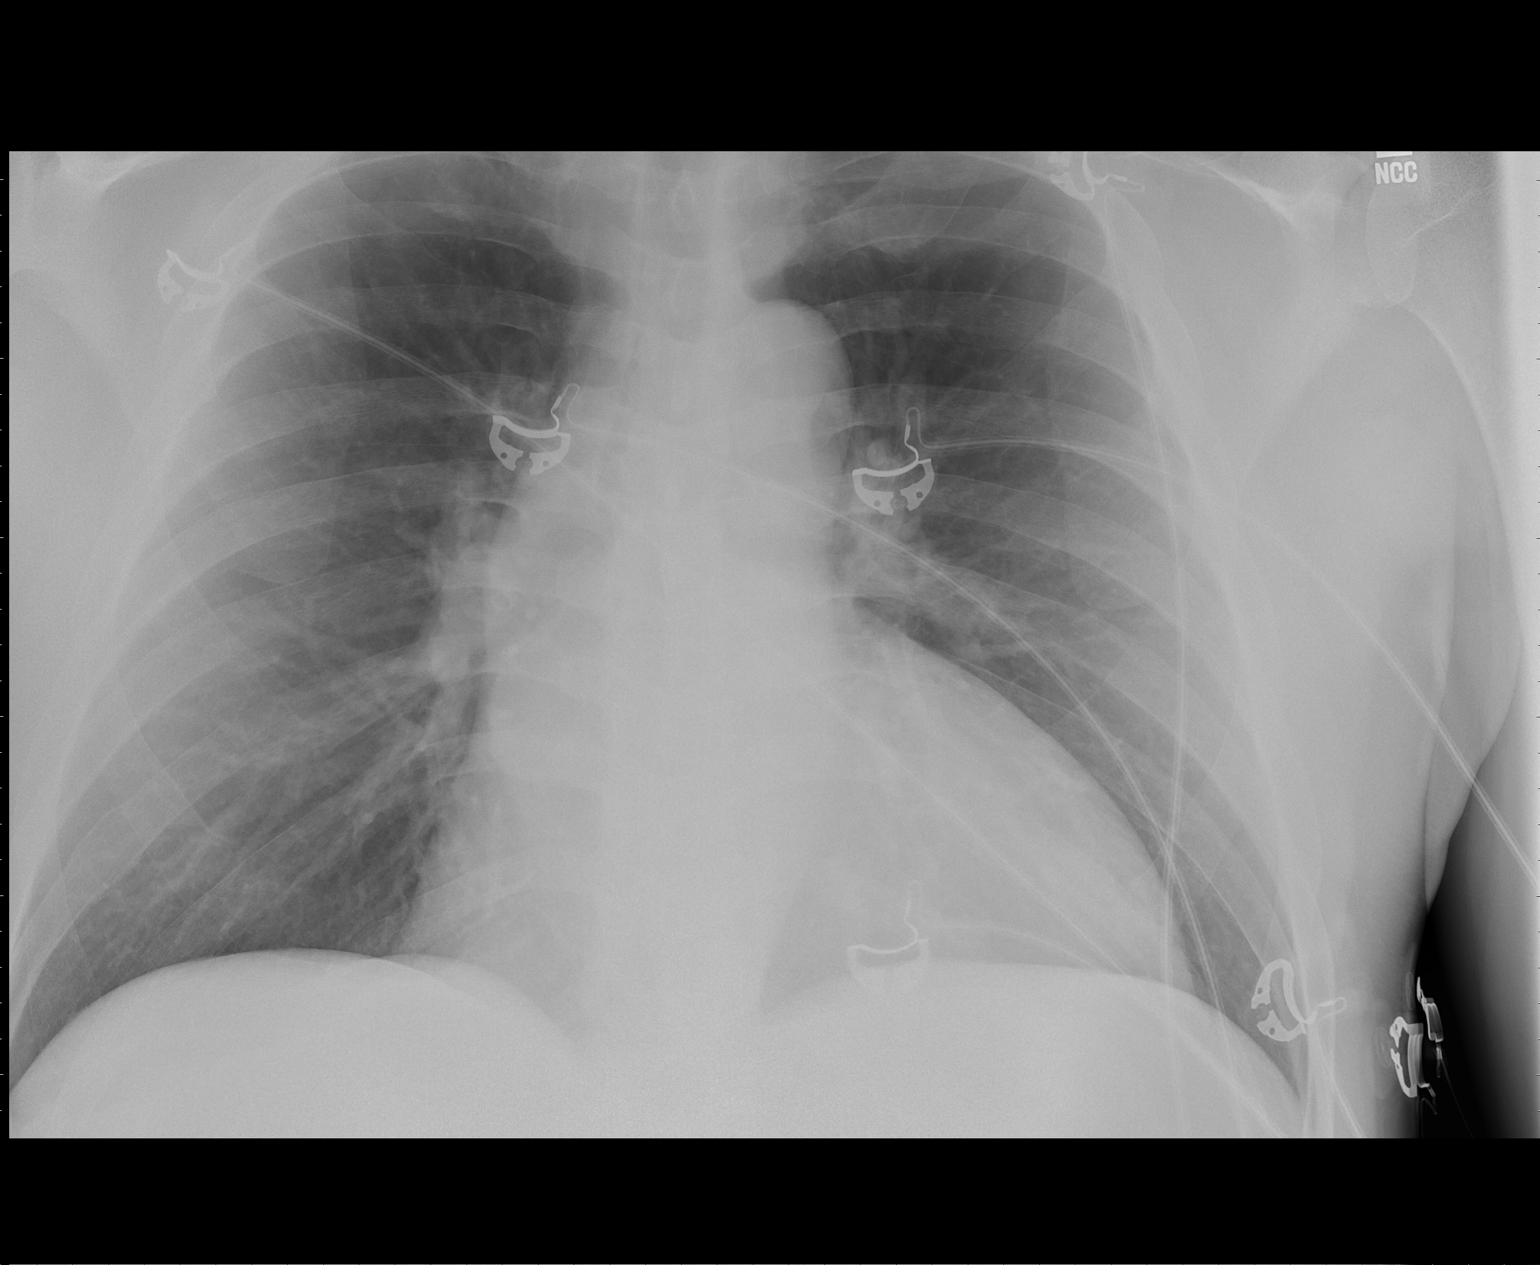

[1 of 1 positions shown; findings below may reference images not displayed]

FINDINGS: Moderate cardiomegaly.  Normal pulmonary vascularity.
Lungs are clear.  No pneumothorax.
IMPRESSION: Cardiomegaly without pulmonary edema.

## 2010-07-08 NOTE — Assessment & Plan Note (Signed)
Summary: device/saf/PC2/pmo pt rs appt frm 2-23/mt   Visit Type:  Follow-up Primary Provider:  Dolores Patty, MD, [facility]   History of Present Illness: The patient presents today for routine electrophysiology followup. He reports doing very well since last being seen in our clinic.  His primary concern is that he has no job or insurance at this time.  Unfortunately, he continues to drink ETOH frequenty.  The patient denies symptoms of palpitations, chest pain, shortness of breath, orthopnea, PND, lower extremity edema, dizziness, presyncope, syncope, or neurologic sequela.   He reports occasional L should discomfort.  The patient is tolerating medications without difficulties and is otherwise without complaint today.   Current Medications (verified): 1)  Allopurinol 300 Mg Tabs (Allopurinol) .... Take One Tablet By Mouth Once Daily. 2)  Prilosec 20 Mg Cpdr (Omeprazole) .... As Needed 3)  Carvedilol 6.25 Mg Tabs (Carvedilol) .... One By Mouth Bid 4)  Diovan 40 Mg Tabs (Valsartan) .... Take One Tablet By Mouth Two Times A Day  Allergies: 1)  ! Ace Inhibitors  Past History:  Past Medical History: Reviewed history from 03/01/2009 and no changes required. ALCOHOL ABUSE (ICD-305.00) SLOW VENTRICULAR TACHYCARDIA      a. cath 10/22/2008 - NL Cors. EF 25%      b. s/p St. Jude Current + DR dual chamber AICD 10/22/2008 NICM/CHRONIC SYST. CHF      a. 09/2008 echo: EF 25% PALPITATIONS/ Afib PRE-SYNCOPE GOUT LBBB   Past Surgical History: Reviewed history from 10/31/2008 and no changes required. Proc. Date: 11/12/00 SURGEON:  Zola Button T. Lazarus Salines, M.D. PROCEDURE:  Closed reduction nasal fracture, with internal and external stabilization.  Proc. Date: 11/08/00 SURGEON:  Molli Hazard A. Mina Marble, M.D. PROCEDURE:  Open reduction internal fixation of above fracture using mini fragment lag screw fixation - two 2.7 x 12 mm screws and one 2.0 x 10 mm screw.  Social History: Reviewed history from  03/01/2009 and no changes required. Unemployed.  He lives with his fiancee in   [place].  Denies any tobacco but does drink alcohol frequently.  Denies drug use.      Review of Systems       All systems are reviewed and negative except as listed in the HPI.   Vital Signs:  Patient profile:   45 year old male Height:      70.5 inches Weight:      230 pounds BMI:     32.65 Pulse rate:   85 / minute BP sitting:   138 / 90  (left arm)  Vitals Entered By: Laurance Flatten CMA (June 30, 2010 11:10 AM)  Physical Exam  General:  Well developed, well nourished, in no acute distress. Head:  normocephalic and atraumatic Eyes:  PERRLA/EOM intact; conjunctiva and lids normal. Mouth:  Teeth, gums and palate normal. Oral mucosa normal. Neck:  Neck supple, no JVD. No masses, thyromegaly or abnormal cervical nodes. Chest Wall:  ICD pocket well healed and nontender Lungs:  Clear bilaterally to auscultation and percussion. Heart:  Non-displaced PMI, chest non-tender; regular rate and rhythm, S1, S2 without murmurs, rubs or gallops. Carotid upstroke normal, no bruit. Normal abdominal aortic size, no bruits. Femorals normal pulses, no bruits. Pedals normal pulses. No edema, no varicosities. Abdomen:  Bowel sounds positive; abdomen soft and non-tender without masses, organomegaly, or hernias noted. No hepatosplenomegaly. Msk:  Back normal, normal gait. Muscle strength and tone normal. Extremities:  No clubbing or cyanosis. Neurologic:  Alert and oriented x 3. Skin:  Intact without  lesions or rashes.    ICD Specifications Following MD:  Hillis Range, MD     ICD Vendor:  St Jude     ICD Model Number:  ZO1096-04     ICD Serial Number:  [id number] ICD DOI:  10/22/2008     ICD Implanting MD:  Hillis Range, MD  Lead 1:    Location: RA     DOI: 10/22/2008     Model #: [id number]     Serial #: [id number]     Status: active Lead 2:    Location: RV     DOI: 10/22/2008     Model #: 5621     Serial #: [id number]      Status: active  Indications::  VT   ICD Follow Up ICD Dependent:  No      Episodes Coumadin:  No  Brady Parameters Mode DDI     Lower Rate Limit:  50     PAV 350      Tachy Zones VF:  222     VT:  173     MD Comments:  see scanned report in PACEART  Impression & Recommendations:  Problem # 1:  CHRONIC SYSTOLIC HEART FAILURE (ICD-428.22) stable without significant volume overload compliance with medicine and ETOH cessation advised  Problem # 2:  VENTRICULAR TACHYCARDIA (ICD-427.1) controlled with amiodarone 100mg  daily recent LFTs ok we will check TFTs today   ICD function is normal see scanned report in PACEART  Problem # 3:  ATRIAL FIBRILLATION (ICD-427.31) stable without recent afib  Problem # 4:  ESSENTIAL HYPERTENSION, BENIGN (ICD-401.1) stable  Problem # 5:  ALCOHOL ABUSE (ICD-305.00) cessation advised  Other Orders: TLB-T4 (Thyrox), Free [phone]) TLB-TSH (Thyroid Stimulating Hormone) (84443-TSH)  Patient Instructions: 1)  Your physician wants you to follow-up in:  3 months in the device clinic and 3 months with Dr Gala Romney You will receive a reminder letter in the mail two months in advance. If you don't receive a letter, please call our office to schedule the follow-up appointment. 2)  Application given for [facility]

## 2010-07-15 IMAGING — CR DG CHEST 2V
2 series · 2 of 2 positions shown · non-contrast
Comparison: 10/15/2008

CLINICAL DATA: Pacemaker placement

CHEST - 2 VIEW

[w chest pa]
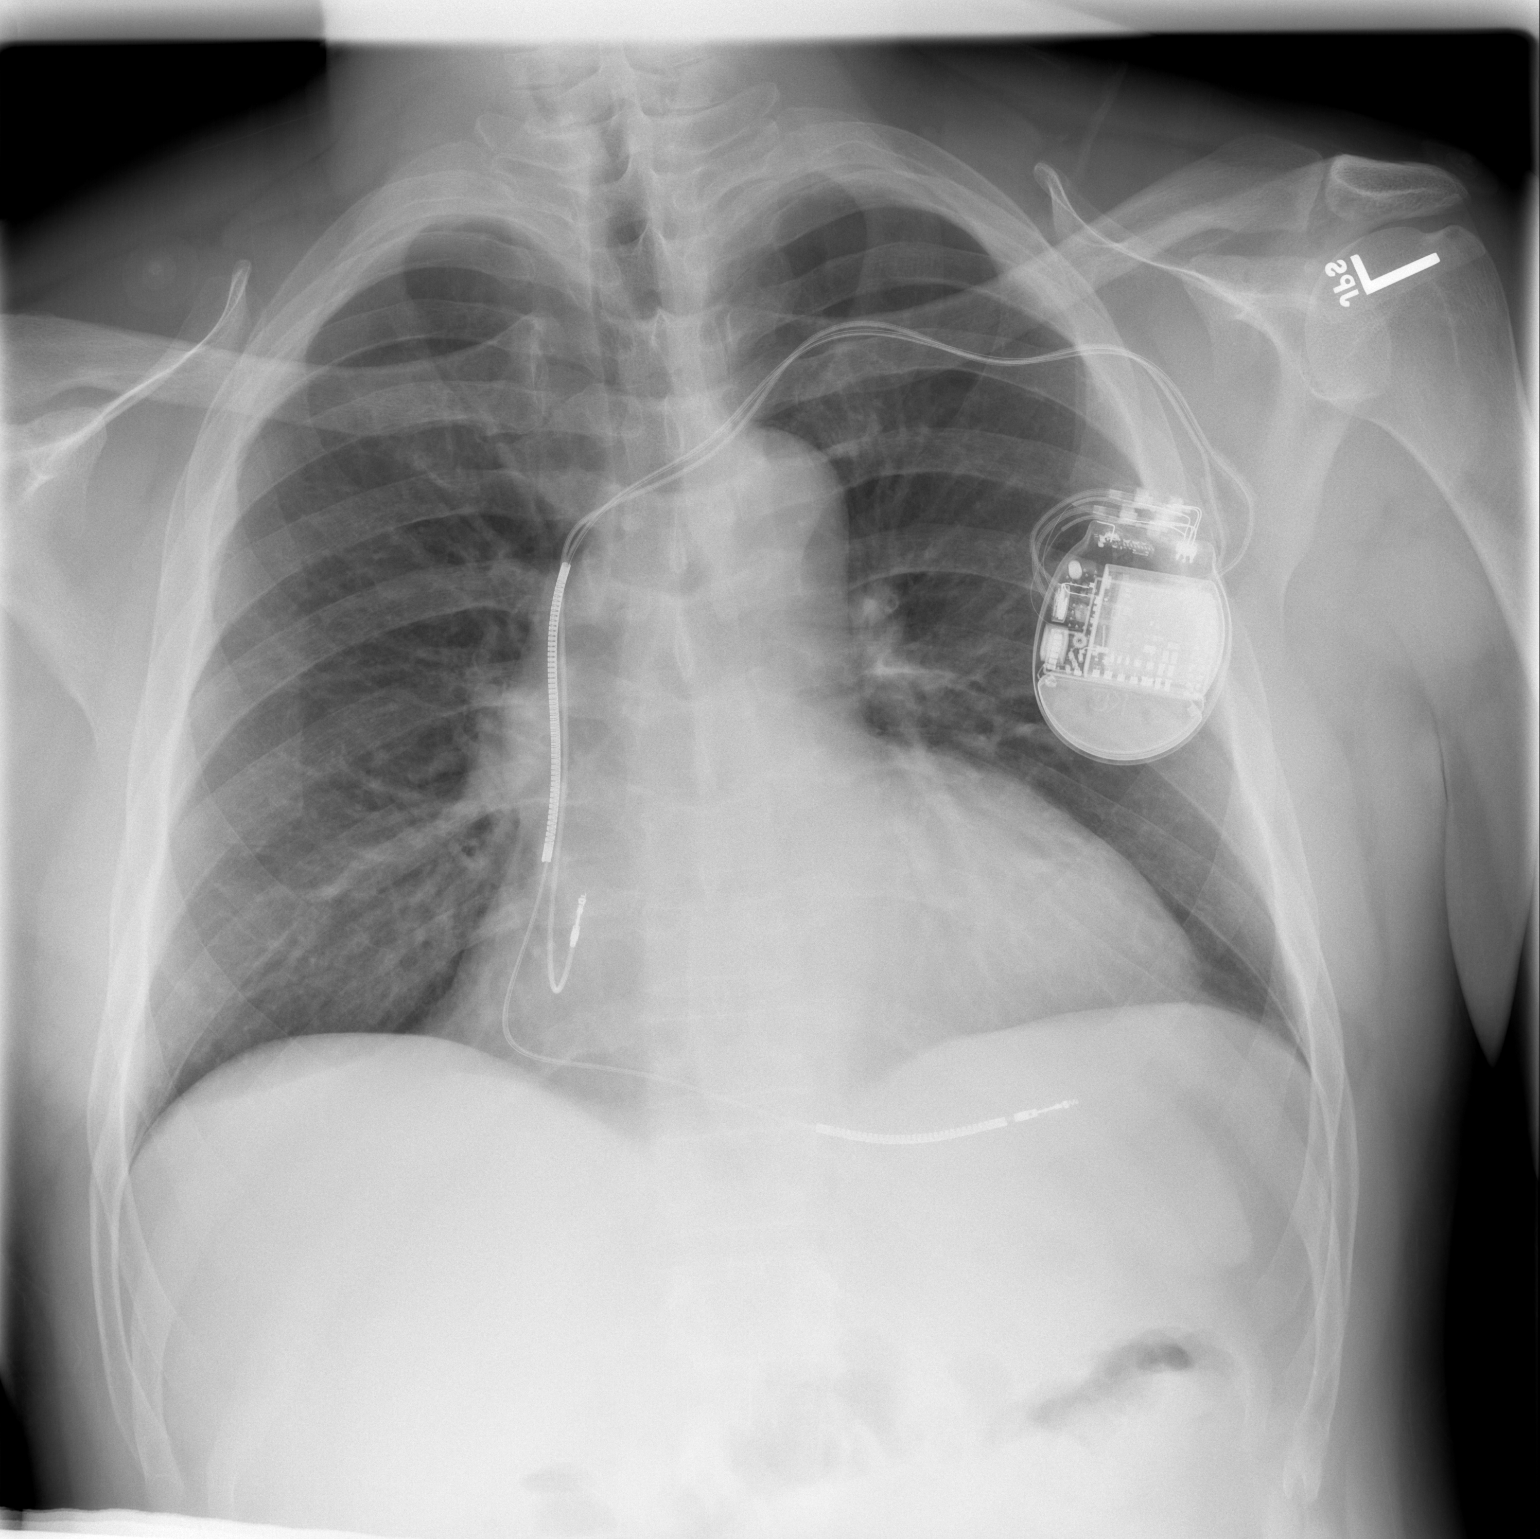

[w chest lat]
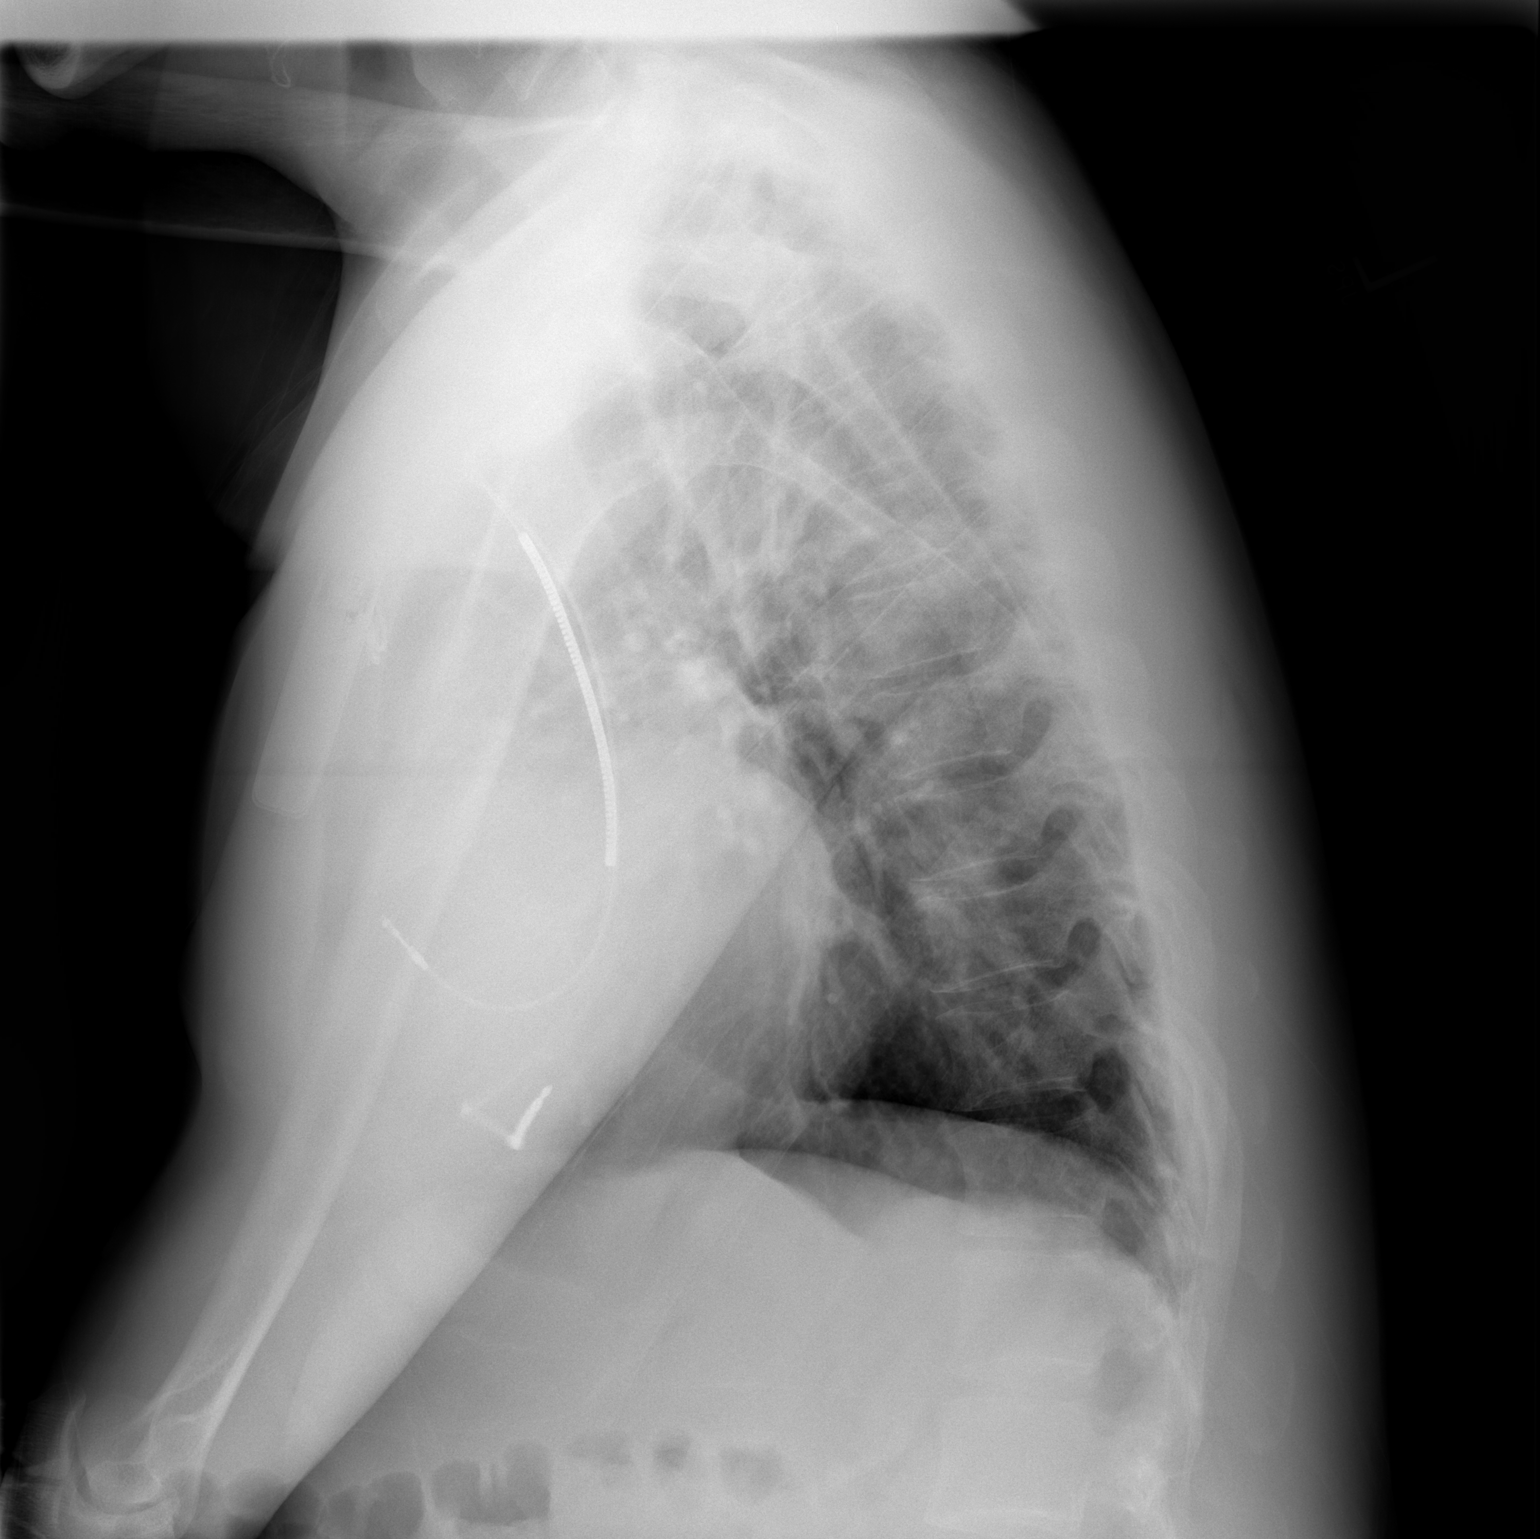

[2 of 2 positions shown; findings below may reference images not displayed]

FINDINGS: Interval placement of a left subclavian AICD, leads
extending to the right atrium and towards the right ventricular
apex.  No pneumothorax.  Lungs clear.  No effusion.  Mild
cardiomegaly stable.
IMPRESSION: 1.  Left AICD placement without pneumothorax or other complication.
2.  Stable cardiomegaly.

## 2010-07-15 NOTE — Cardiovascular Report (Signed)
Summary: Office Visit   Office Visit   Imported By: Roderic Ovens 07/07/2010 14:28:10  _____________________________________________________________________  External Attachment:    Type:   Image     Comment:   External Document

## 2010-08-03 LAB — CBC
HCT: 43.3 % (ref 39.0–52.0)
Hemoglobin: 14.1 g/dL (ref 13.0–17.0)
MCHC: 32.6 g/dL (ref 30.0–36.0)
MCV: 88 fL (ref 78.0–100.0)
Platelets: 229 10*3/uL (ref 150–400)
RBC: 4.92 MIL/uL (ref 4.22–5.81)
RDW: 13.9 % (ref 11.5–15.5)
WBC: 10.3 10*3/uL (ref 4.0–10.5)

## 2010-08-03 LAB — PROTIME-INR: INR: 2.4 — ABNORMAL HIGH (ref 0.00–1.49)

## 2010-08-04 LAB — DIFFERENTIAL
Basophils Relative: 1 % (ref 0–1)
Eosinophils Absolute: 0.4 10*3/uL (ref 0.0–0.7)
Lymphs Abs: 2.2 10*3/uL (ref 0.7–4.0)
Monocytes Absolute: 0.6 10*3/uL (ref 0.1–1.0)
Monocytes Relative: 10 % (ref 3–12)
Neutrophils Relative %: 49 % (ref 43–77)

## 2010-08-04 LAB — BASIC METABOLIC PANEL
BUN: 12 mg/dL (ref 6–23)
CO2: 24 mEq/L (ref 19–32)
CO2: 24 mEq/L (ref 19–32)
CO2: 25 mEq/L (ref 19–32)
Calcium: 8.9 mg/dL (ref 8.4–10.5)
Calcium: 9.4 mg/dL (ref 8.4–10.5)
Chloride: 101 mEq/L (ref 96–112)
Chloride: 103 mEq/L (ref 96–112)
Chloride: 105 mEq/L (ref 96–112)
Chloride: 110 mEq/L (ref 96–112)
Chloride: 98 mEq/L (ref 96–112)
Creatinine, Ser: 1.13 mg/dL (ref 0.4–1.5)
GFR calc Af Amer: >60 mL/min (ref >60)
GFR calc Af Amer: >60 mL/min (ref >60)
GFR calc Af Amer: >60 mL/min (ref >60)
GFR calc Af Amer: >60 mL/min (ref >60)
GFR calc non Af Amer: >60 mL/min (ref >60)
GFR calc non Af Amer: >60 mL/min (ref >60)
Glucose, Bld: 100 mg/dL — ABNORMAL HIGH (ref 70–99)
Potassium: 4.1 mEq/L (ref 3.5–5.1)
Potassium: 4.5 mEq/L (ref 3.5–5.1)
Potassium: 4.6 mEq/L (ref 3.5–5.1)
Potassium: 4.6 mEq/L (ref 3.5–5.1)
Sodium: 131 mEq/L — ABNORMAL LOW (ref 135–145)
Sodium: 132 mEq/L — ABNORMAL LOW (ref 135–145)
Sodium: 134 mEq/L — ABNORMAL LOW (ref 135–145)
Sodium: 141 mEq/L (ref 135–145)

## 2010-08-04 LAB — CBC
HCT: 39.1 % (ref 39.0–52.0)
HCT: 40.6 % (ref 39.0–52.0)
HCT: 40.8 % (ref 39.0–52.0)
HCT: 42.6 % (ref 39.0–52.0)
HCT: 43.5 % (ref 39.0–52.0)
HCT: 45.2 % (ref 39.0–52.0)
HCT: 45.3 % (ref 39.0–52.0)
HCT: 46.4 % (ref 39.0–52.0)
Hemoglobin: 13.2 g/dL (ref 13.0–17.0)
Hemoglobin: 13.8 g/dL (ref 13.0–17.0)
Hemoglobin: 14.7 g/dL (ref 13.0–17.0)
Hemoglobin: 15 g/dL (ref 13.0–17.0)
Hemoglobin: 15.1 g/dL (ref 13.0–17.0)
Hemoglobin: 15.3 g/dL (ref 13.0–17.0)
Hemoglobin: 16.3 g/dL (ref 13.0–17.0)
MCHC: 33.3 g/dL (ref 30.0–36.0)
MCHC: 33.5 g/dL (ref 30.0–36.0)
MCHC: 33.8 g/dL (ref 30.0–36.0)
MCHC: 33.8 g/dL (ref 30.0–36.0)
MCHC: 33.8 g/dL (ref 30.0–36.0)
MCHC: 33.8 g/dL (ref 30.0–36.0)
MCV: 88.4 fL (ref 78.0–100.0)
MCV: 88.4 fL (ref 78.0–100.0)
MCV: 88.4 fL (ref 78.0–100.0)
MCV: 89.2 fL (ref 78.0–100.0)
MCV: 89.2 fL (ref 78.0–100.0)
Platelets: 181 10*3/uL (ref 150–400)
Platelets: 194 10*3/uL (ref 150–400)
Platelets: 205 10*3/uL (ref 150–400)
Platelets: 227 10*3/uL (ref 150–400)
RBC: 4.42 MIL/uL (ref 4.22–5.81)
RBC: 4.56 MIL/uL (ref 4.22–5.81)
RBC: 4.61 MIL/uL (ref 4.22–5.81)
RBC: 4.67 MIL/uL (ref 4.22–5.81)
RBC: 4.92 MIL/uL (ref 4.22–5.81)
RBC: 5.11 MIL/uL (ref 4.22–5.81)
RBC: 5.12 MIL/uL (ref 4.22–5.81)
RDW: 14.3 % (ref 11.5–15.5)
RDW: 14.4 % (ref 11.5–15.5)
WBC: 10.9 10*3/uL — ABNORMAL HIGH (ref 4.0–10.5)
WBC: 12 10*3/uL — ABNORMAL HIGH (ref 4.0–10.5)
WBC: 12.7 10*3/uL — ABNORMAL HIGH (ref 4.0–10.5)
WBC: 14.5 10*3/uL — ABNORMAL HIGH (ref 4.0–10.5)
WBC: 6.4 10*3/uL (ref 4.0–10.5)
WBC: 7.4 10*3/uL (ref 4.0–10.5)

## 2010-08-04 LAB — COMPREHENSIVE METABOLIC PANEL
ALT: 28 U/L (ref 0–53)
Alkaline Phosphatase: 49 U/L (ref 39–117)
BUN: 11 mg/dL (ref 6–23)
Chloride: 106 mEq/L (ref 96–112)
Glucose, Bld: 105 mg/dL — ABNORMAL HIGH (ref 70–99)
Potassium: 4.1 mEq/L (ref 3.5–5.1)
Total Bilirubin: 0.5 mg/dL (ref 0.3–1.2)

## 2010-08-04 LAB — POCT CARDIAC MARKERS: Troponin i, poc: <0.05 ng/mL (ref 0.00–0.09)

## 2010-08-04 LAB — RAPID URINE DRUG SCREEN, HOSP PERFORMED
Amphetamines: NOT DETECTED
Barbiturates: NOT DETECTED
Opiates: NOT DETECTED
Tetrahydrocannabinol: POSITIVE — AB

## 2010-08-04 LAB — MAGNESIUM
Magnesium: 2.1 mg/dL (ref 1.5–2.5)
Magnesium: 2.2 mg/dL (ref 1.5–2.5)

## 2010-08-04 LAB — T4: T4, Total: 5.6 ug/dL (ref 5.0–12.5)

## 2010-08-04 LAB — TROPONIN I: Troponin I: 0.02 ng/mL (ref 0.00–0.06)

## 2010-08-04 LAB — CK TOTAL AND CKMB (NOT AT ARMC)
CK, MB: 3.9 ng/mL (ref 0.3–4.0)
Relative Index: 1.6 (ref 0.0–2.5)

## 2010-08-04 LAB — HEPARIN LEVEL (UNFRACTIONATED)
Heparin Unfractionated: 0.29 IU/mL — ABNORMAL LOW (ref 0.30–0.70)
Heparin Unfractionated: 0.49 IU/mL (ref 0.30–0.70)
Heparin Unfractionated: 0.61 IU/mL (ref 0.30–0.70)
Heparin Unfractionated: 0.69 IU/mL (ref 0.30–0.70)
Heparin Unfractionated: <0.1 IU/mL — ABNORMAL LOW (ref 0.30–0.70)
Heparin Unfractionated: <0.1 IU/mL — ABNORMAL LOW (ref 0.30–0.70)

## 2010-08-04 LAB — PROTIME-INR
INR: 0.9 (ref 0.00–1.49)
INR: 1 (ref 0.00–1.49)
INR: 1.1 (ref 0.00–1.49)
INR: 1.2 (ref 0.00–1.49)
INR: 1.5 (ref 0.00–1.49)
Prothrombin Time: 13.6 seconds (ref 11.6–15.2)
Prothrombin Time: 15.3 seconds — ABNORMAL HIGH (ref 11.6–15.2)

## 2010-08-04 LAB — SYNOVIAL CELL COUNT + DIFF, W/ CRYSTALS: Monocyte-Macrophage-Synovial Fluid: 24 % — ABNORMAL LOW (ref 50–90)

## 2010-08-04 LAB — HEPATIC FUNCTION PANEL
ALT: 40 U/L (ref 0–53)
AST: 38 U/L — ABNORMAL HIGH (ref 0–37)
Bilirubin, Direct: 0.2 mg/dL (ref 0.0–0.3)
Indirect Bilirubin: 0.9 mg/dL (ref 0.3–0.9)
Total Protein: 7.1 g/dL (ref 6.0–8.3)

## 2010-08-04 LAB — APTT: aPTT: 27 seconds (ref 24–37)

## 2010-08-04 LAB — BODY FLUID CULTURE: Culture: NO GROWTH

## 2010-08-04 LAB — CARDIAC PANEL(CRET KIN+CKTOT+MB+TROPI)
Relative Index: 1.7 (ref 0.0–2.5)
Total CK: 193 U/L (ref 7–232)

## 2010-08-04 LAB — CLOSTRIDIUM DIFFICILE EIA: C difficile Toxins A+B, EIA: NEGATIVE

## 2010-08-04 LAB — T4, FREE: Free T4: 1.04 ng/dL (ref 0.80–1.80)

## 2010-08-04 LAB — PATHOLOGIST SMEAR REVIEW

## 2010-08-04 LAB — URIC ACID: Uric Acid, Serum: 6.3 mg/dL (ref 4.0–7.8)

## 2010-09-09 NOTE — Op Note (Signed)
NAMEKAREE, CHRISTOPHERSON              ACCOUNT NO.:  [id number]   MEDICAL RECORD NO.:  [id number]          PATIENT TYPE:  INP   LOCATION:  3315                         FACILITY:  [facility]   PHYSICIAN:  Hillis Range, MD       DATE OF BIRTH:  01-Apr-1966   DATE OF PROCEDURE:  DATE OF DISCHARGE:                               OPERATIVE REPORT   SURGEON:  Hillis Range, MD   PREPROCEDURE DIAGNOSES:  1. Ventricular tachycardia.  2. Nonischemic cardiomyopathy.  3. New York Heart Association class II.  4. Left bundle-branch block.   POSTPROCEDURE DIAGNOSES:  1. Ventricular tachycardia.  2. Nonischemic cardiomyopathy.  3. New York Heart Association class II.  4. Left bundle-branch block.   INTRODUCTION:  [name] Harrington is a pleasant 45 year old gentleman who was  admitted with symptomatic ventricular tachycardia.  He has a nonischemic  cardiomyopathy with an ejection fraction of 20%.  He has been treated  with intravenous amiodarone, subsequently oral amiodarone for sustained  and frequently recurrent ventricular tachycardia.  He has had some  improvement in the frequency of his ventricular tachycardia.  Nevertheless, he continues to have a ventricular tachycardia of a right  bundle-branch, right inferior axis with variable cycle length ranging  from 420 milliseconds to 310 milliseconds.  He therefore presents for  ICD implantation for secondary treatment of his ventricular tachycardia.   DESCRIPTION OF PROCEDURE:  Informed written consent was obtained and the  patient was brought to the electrophysiology lab in the fasting state.  He was adequately sedated with intravenous Versed and fentanyl as  outlined in the nursing report.  The patient's left chest was prepped  and draped in the usual sterile fashion by the EP lab staff.  The skin  overlying the left deltopectoral region was infiltrated with lidocaine  for local analgesia.  A 5-cm incision was made over the deltopectoral  groove.  A left  subcutaneous defibrillator pocket was fashioned using a  combination of sharp and blunt dissection.  Electrocautery was used to  assure hemostasis.  The left cephalic vein was visualized and  cannulated.  Through the left cephalic vein, a St. Jude Medical Tendril  SDX model [phone] (serial number [id number]) right atrial lead and a St.  Jude Medical Hudson, model [phone] (serial number [id number]) right  ventricular defibrillator lead were advanced with fluoroscopic  visualization into the right atrial appendage and right ventricular apex  positions respectively.  The leads were then secured to the pectoralis  fascia using #2 silk suture over the suture sleeves.  Initial lead  measurements revealed an atrial lead P-wave of 4.6 millivolts with  impedance of 628 ohms and a threshold of 1.4 volts at 0.5 milliseconds.  The right ventricular lead R-wave measured 23 millivolts with an  impedance of 1199 ohms and a threshold of 1 volt at 0.5 milliseconds.  The pocket was then irrigated with copious gentamicin solution.  Electrocautery was again used to assure hemostasis.  The leads were then  connected to a St. Jude Medical current plus DR model [phone] (serial  number [id number]) dual-chamber defibrillator.  The defibrillator was placed  into the previously fashioned pockets.  Ventricular fibrillation was  then induced with a T shock.  Adequate sensing of ventricular  fibrillation was observed with minimal dropout.  Shocks were delivered  from the device at 10 joules and subsequently 20 joules and failed to  defibrillate the patient.  He was therefore successfully externally  defibrillated with a single 360 joule biphasic shock delivered through  an external defibrillator with cardioversion electrodes in the anterior-  posterior thoracic configuration.  The patient was allowed to recover  for 4 minutes.  Ventricular fibrillation was again induced with a T  shock and a 23.8-volt optimized shock  failed to defibrillate the  patient.  A 31.1 joule shock delivered through the device successfully  defibrillated the patient to sinus rhythm.  The SV sequel was excluded  and then a 26.1 joule shock was unsuccessful in defibrillating the  patient after an additional episode of ventricular fibrillation was  induced.  The patient was therefore externally rescued with a 360 joule  shock delivered from an external defibrillator.  I therefore elected to  reposition the right ventricular lead.  The lead was freed from the  pectoralis fascia and unsecured from the myocardial wall.  The lead was  gently repositioned into a more apical position.  In this location, the  initial lead R-wave measured 19 millivolts with impedance of 1199 ohms  and a threshold of 0.75 volts at 0.5 milliseconds.  The lead was then re-  secured to the pectoralis fascia using #2 silk suture.  The pocket was  again irrigated with gentamicin solution.  The lead was then connected  to the defibrillator, which was returned to the left subcutaneous  defibrillator pocket.  The pocket was then closed in 2 layers with 2.0  Vicryl suture for the subcutaneous and subcuticular layers.  Steri-  Strips and a sterile dressing were then applied.  Ventricular  fibrillation was again induced with a T shock and a 25 joule shock  delivered from the device successfully defibrillating the patient to  sinus rhythm.  He was allowed to recover for 4 minutes.  Ventricular  fibrillation was again induced and a 25-joule shock successfully  defibrillated the patient to sinus rhythm.  He was allowed to recover  for an additional 4 minutes.  Programmed extra stimulus testing was then  performed through the device with a basic cycle length of 500  milliseconds down to S1, S2, S3, S4 refractoriness with no inducible  ventricular tachycardia.  The procedure was therefore considered  completed.  There were no early apparent complications.    CONCLUSIONS:  1. Successful implantation of a St. Jude Medical current plus DR ICD.  2. DFT less than or equal to 25 joules.  3. Ventricular tachycardia is no longer inducible on oral amiodarone.  4. No early apparent complications.      Hillis Range, MD  Electronically Signed     JA/MEDQ  D:  10/22/2008  T:  10/23/2008  Job:  [id number]   cc:   Bevelyn Buckles. Bensimhon, MD

## 2010-09-09 NOTE — Consult Note (Signed)
NAMELYNN, SISSEL              ACCOUNT NO.:  [id number]   MEDICAL RECORD NO.:  [id number]          PATIENT TYPE:  INP   LOCATION:  3315                         FACILITY:  [facility]   PHYSICIAN:  Vanita Panda. Magnus Ivan, M.D.DATE OF BIRTH:  Jul 08, 1965   DATE OF CONSULTATION:  10/18/2008  DATE OF DISCHARGE:                                 CONSULTATION   CONSULTING PHYSICIANS:  Bevelyn Buckles. Bensimhon, MD  Hillis Range, MD  Mosetta Pigeon, Physician Assistant.   REASON FOR CONSULTATION:  Left knee pain and large effusion with locked  knee.   HISTORY OF PRESENT ILLNESS:  Briefly, Mr. Fennell is a 45 year old  gentleman, who is admitted to the hospital on October 15, 2008 with  palpitations and pre syncope.  He was diagnosed with atrial fibrillation  with rapid ventricular response.  He is admitted for treatment of this  by Dr. Nicholes Mango.  He is a convalescing in the step-down intensive  care unit, while his rate is under better control and he has had been  bridged to a Coumadin for blood thinning.  Over the last 24 hours, he  had developed left knee pain with swelling.  He denies any recent injury  to his knee, relating problems with his knee in the past.  His right  knee is hurting him some, but the left knee has gotten to be where as  painful that it is swollen and he says he can bend it well.  He denies  any other recent infections or illnesses other than what he has been  hospital for this point.  He does have a family medical history, he says  of gout, and he has had a drinking history of alcohol in the past as  well.  He denies any other joint complaints.   REVIEW OF SYSTEMS:  Negative for chest pain, shortness of breath, fever,  chills, nausea, and vomiting.  He denies other joint problems except for  bilateral knee pain with a left being much worse than the right.   PAST MEDICAL HISTORY:  1. Atrial fibrillation as above.  2. GERD.  3. Moderate alcohol use.   FAMILY MEDICAL  HISTORY:  Positive for gout.   ALLERGIES:  No known drug allergies.   MEDICATIONS:  1. Aspirin.  2. Librium.  3. Lisinopril.  4. Protonix.  5. Coumadin.  6. P.r.n. bowel medications.   SOCIAL HISTORY:  He does work at [employer].  He is married.  He has a  fiance and lives in [place].  He has a history of alcohol use, but no  tobacco or drug use.   PHYSICAL EXAMINATION:  VITAL SIGNS:  Temperature 98, blood pressure  119/54, heart rate 79 and regular.  GENERAL:  He is alert and oriented in no acute distress and minimal  discomfort.  EXTREMITIES:  examination of both his knee showed the right knee has no  effusion, has good range of motion, just mild pain.  There is no  problems of the hip, the foot, or ankle in the left side or on the right  side.  His knee has a towel  underneath it.  He has a flexed position.  I  have difficulty flexing and extending the knee secondary to pain and is  noted warmth and a large effusion of the knee.  There is no erythema.  He has soft calf and normal.  On hip examined and ankle examined at left  side, he has good sensation in his foot and is well perfused.  There is  no other joint problems in his upper extremities and no wounds that I  can see.   LABORATORY EXAMS:  His peripheral white blood cell count today is 11.1,  it was 7.4 yesterday.  His uric acid level peripherally are systemically  is pending.   A x-ray was obtained of the left knee including AP and lateral and shows  good alignment a mild effusion.  There is no evidence of fracture  dislocation.   IMPRESSION:  This a 45 year old male hospitalized with atrial  fibrillation, who has an acute left knee effusion pain.   RECOMMENDATIONS:  At the bedside, I talked to the patient length about  the possibility of this being an infectious process versus gout.  I  prepped his knee with Betadine and alcohol and after 5 mL of lidocaine  to the superior and lateral aspect of the knee, was  able to aspirate 60  mL of straw-colored fluid from the knee.  I am seeing this.  I felt this  was more likely an inflammatory process, so I did place an injection of  3 mL of Marcaine and 1 mL of Depo-Medrol into his knee through the same  access area, after counsel him extensively about this and steroids the  risks and benefits.  He tolerated this procedure well and reports much  less pain after the procedure was performed.  I then removed the back  and forth.  The flexion/extension, he was much more comfortable and the  effusion was almost completely gone.  The knee aspirate will be sent to  the laboratory for analysis including a Gram stain and culture, cell  count, and crystal analysis.  I will follow him closely and await  decisions depending on the next 24-hour.  We will make decisions of what  to do next with the knee depending on how the response to the aspiration  over the next 24 hours.  I will follow him closely while he was in the  hospital.      Vanita Panda. Magnus Ivan, M.D.  Electronically Signed     CYB/MEDQ  D:  10/18/2008  T:  10/19/2008  Job:  [id number]   cc:   Bevelyn Buckles. Bensimhon, MD
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
## 2010-09-09 NOTE — Discharge Summary (Signed)
NAMESHARONE, [name]              ACCOUNT NO.:  [id number]   MEDICAL RECORD NO.:  [id number]          PATIENT TYPE:  INP   LOCATION:  3315                         FACILITY:  [facility]   PHYSICIAN:  Bevelyn Buckles. Bensimhon, MDDATE OF BIRTH:  [birth year]65   DATE OF ADMISSION:  10/15/2008  DATE OF DISCHARGE:  10/24/2008                         DISCHARGE SUMMARY - REFERRING   PRIMARY DIAGNOSIS:  1. Ventricular tachycardia.  2. Nonischemic cardiomyopathy, question tachycardic induced      cardiomyopathy.  3. Gout.   SECONDARY DIAGNOSIS:  History of polysubstance abuse.   HOSPITAL COURSE:  [name] Harrington is very pleasant 45 year old male with no  previous cardiac history.  He does have a history of moderate alcohol  use and occasional cocaine use.   He presented to the emergency room on October 18, 2008, with a several week  history of palpitations.  These were previously very transient.  However, on the day of admission they were acutely worse and persistent.  He had presyncope.  On arrival to the ER, he had a wide complex  irregular rhythm which was initially thought to be atrial fibrillation  with rates up to 200.  He developed some hemodynamic instability.  He  was started on amiodarone in the emergency room.  A bedside echo showed  an ejection fraction of 20-25%.  He was admitted to step-down.  He was  continued on amiodarone with improvement of his rhythm.  Given the fact  that it was believed that he had atrial fibrillation, he was started on  Coumadin, particularly in the setting of a low ejection fraction.  He  then continued to have bursts of wide complex tachycardia which became  more concerning for ventricular tachycardia.  He was seen by Dr. Graciela Husbands,  who evaluated his rhythm and felt certain that this was ventricular  tachycardia with an axis shift.  Amiodarone was continued; however,  despite amiodarone he was having breakthrough tachycardia.  After a long  discussion it was decided he  would undergo implantation of an ICD.  Prior to his ICD implantation, he underwent a cardiac catheterization.  This showed normal coronary arteries with an ejection fraction of 25%.  He then underwent ICD placement by Dr. Johney Frame on October 22, 2008.  On the  day after implant, he continued to have several runs of sustained  ventricular tachycardia.  These were dissipated prior to getting  shocked.  Amiodarone was continued and the tachycardia was suppressed.   While in the hospital, he also had an episode of what appeared to be  gout.  This was treated with prednisone and resolved.  He was  subsequently started on allopurinol.   Given his history of polysubstance abuse, he was started on the alcohol  withdrawal program with Librium.  He did not have any problems with  alcohol withdrawal.   While in the hospital, he was also loaded on Coumadin.  This was  primarily due to the fact that we thought he had some atrial  fibrillation.  However, when it was discovered it was mostly VT, we  decided to continue the Coumadin for a brief period  of time given his  significant LV dysfunction and the risk of thromboembolic complications.  INR on discharge was 2.4.   On the day of discharge, he was able to ambulate the halls without any  problems.  Defibrillator site looked good.  His telemetry was quiescent.   MEDICATIONS ON DISCHARGE:  1. Amiodarone 400 mg b.i.d.  2. Warfarin 5 mg a day to be adjusted in Coumadin Clinic to maintain      an INR of 2.0-3.0.  3. Lisinopril 20 mg a day.  4. Protonix 40 mg a day.  5. Carvedilol 3.125 mg b.i.d.  6. Allopurinol 100 mg a day.   FOLLOW-UP AFTER DISCHARGE:  1. Dr. Gala Romney in 2-4 weeks.  2. Dr. Johney Frame, electrophysiology.  3. Coumadin clinic.  He will follow up on Friday, October 26, 2008.   He was counseled on the need to abstain from alcohol and any other  illicit substances.  He will be kept out of work until further notice.      Bevelyn Buckles.  Bensimhon, MD  Electronically Signed     DRB/MEDQ  D:  10/24/2008  T:  10/24/2008  Job:  [id number]

## 2010-09-09 NOTE — Cardiovascular Report (Signed)
NAMELEGEND, TUMMINELLO              ACCOUNT NO.:  [id number]   MEDICAL RECORD NO.:  [id number]          PATIENT TYPE:  INP   LOCATION:  3315                         FACILITY:  [facility]   PHYSICIAN:  Bevelyn Buckles. Bensimhon, MDDATE OF BIRTH:  [birth year]   DATE OF PROCEDURE:  10/22/2008  DATE OF DISCHARGE:                            CARDIAC CATHETERIZATION   INDICATION:  Ventricular tachycardia and cardiomyopathy.   PROCEDURES PERFORMED:  1. Selective coronary angiography.  2. Left heart catheterization.  3. Left ventriculogram.  4. StarClose femoral artery closure device.   DESCRIPTION OF PROCEDURE:  The risks and indications were explained.  Consent was signed and placed on the chart.  A 5-French arterial sheath  was placed in the right femoral artery using a modified Seldinger  technique.  Standard catheters including JL-4, JR-4, and angled pigtail  were used for catheterization.  All catheter exchanges were made over  wire.  There were no apparent complications.  Central aortic pressure  was 96/66 with a mean of 78.  LV pressure was 94/1 with an EDP of 3.  There was no aortic stenosis.   Left main was normal.   LAD was a large vessel giving off a very large first diagonal and small-  to-moderate second and third diagonals.  The LAD was angiographically  normal.   Left circ gave off an OM-1 and OM-2, was angiographically normal.   Right coronary was a large dominant vessel, which was angiographically  normal.   Left ventriculogram done in the RAO position showed an EF of 25% with  global hypokinesis.   ASSESSMENT:  1. Normal coronary arteries.  2. Nonischemic cardiomyopathy with an ejection fraction of 25%.   PLAN/DISCUSSION:  He will undergo ICD placement per EP.  We will  continue amiodarone.      Bevelyn Buckles. Bensimhon, MD  Electronically Signed     DRB/MEDQ  D:  10/22/2008  T:  10/23/2008  Job:  [id number]

## 2010-09-09 NOTE — Discharge Summary (Signed)
[name] Harrington, [name] Harrington              ACCOUNT NO.:  [id number]   MEDICAL RECORD NO.:  [id number]          PATIENT TYPE:  INP   LOCATION:  4729                         FACILITY:  [facility]   PHYSICIAN:  Hillis Range, MD       DATE OF BIRTH:  [birth year]   DATE OF ADMISSION:  10/15/2008  DATE OF DISCHARGE:  10/25/2008                               DISCHARGE SUMMARY   ATTENDING PHYSICIANS:  1. Hillis Range, MD  2. Bevelyn Buckles. Bensimhon, MD   FINAL DIAGNOSES:  1. Palpitations with presyncope.  2. Wide complex irregular rhythm, thought atrial fibrillation.  3. Close analysis proves this is a wide complex tachycardia, a      ventricular tachycardia.  4. The patient was started on amiodarone with breakthrough pacemaker-      mediated tachycardia.  5. Implantable cardioverter-defibrillator programmed to DDI pacing      mode.  6. Ventricular tachycardia monitor zone is 101-172 beats per minute.      This will catch any slow ventricular tachycardia until follow up.  7. Coumadin therapy for decreased ejection fraction.  8. The patient is on an amiodarone taper, eventually going to 200 mg      by July 31.  9. The patient had angioedema with the ACE inhibitor lisinopril, this      now is an important allergy for him.  Allergy to ACE INHIBITORS.  10.The patient has a St. Jude CURRENT + DR dual-chamber cardioverter-      defibrillator implanted, October 22, 2008, by Dr. Johney Frame.      defibrillator threshold study less than or equal to 25 joules.  The      patient also had left heart catheterization the same day, October 22, 2008, ejection fraction 25% and angiographically normal coronary      anatomy.   MEDICATIONS AT DISCHARGE:  1. Amiodarone 200 mg tablets 2 tablets in the morning and 2 tablets in      the evening from Thursday, July 1 to Thursday, July 15, then 1      tablet in the morning and 1 tablet in the evening from Friday, July      16 to Friday, July 30 and 1 tablet daily starting Saturday,  July      31.  2. Coumadin 5 mg daily or as directed by the Coumadin Clinic.  3. Prilosec 20 mg daily.  4. Coreg 3.125 mg twice daily.  5. Allopurinol 100 mg daily.  6. Benadryl 25 mg daily for next 5 days for his angioedema.  7. Ranitidine 150 mg 1 tablet twice daily for next 5 days.   He is asked to abstain from work until further notice, although he has  been given a notice to give his employer that he has had a cardiac  workup and a decision will be made for him to return to work on July 8  at an office visit.   The patient is to keep his incision dry for the next 4 days, to sponge  bathe until Monday, July 5.  Mobility of the left arm  has been discussed  with the patient prior to discharge.   FOLLOWUP APPOINTMENTS:  [facility], [address]  1. Coumadin Clinic, Friday, July 2 at 8:30.  2. To see Dr. Prescott Gum PA, Thursday, July 8 at 4 o'clock.  The      device will be interrogated to ascertain if there is any recurrence      of slow VT.  Dr. Johney Frame will be in the office and could give an      okay for the patient go back to work.  3. ICD Clinic, Thursday, July 15 at 9 o'clock.  4. To see Dr. Johney Frame, Friday, September 17 at 9:40.   This dictation greater than 35 minutes with explanation to the patient.      Maple Mirza, [place]      Hillis Range, MD  Electronically Signed    GM/MEDQ  D:  10/25/2008  T:  10/26/2008  Job:  [id number]

## 2010-09-12 NOTE — Op Note (Signed)
[place]. [facility]  Patient:    [name] Harrington, [name] Harrington                       MRN: [id number] Proc. Date: 11/08/00 Adm. Date:  [id number] Disc. Date: [id number] Attending:  Osvaldo Human                           Operative Report  PREOPERATIVE DIAGNOSIS:  Left index finger displaced metacarpal fracture.  POSTOPERATIVE DIAGNOSIS:  Left index finger displaced metacarpal fracture.  PROCEDURE:  Open reduction internal fixation of above fracture using mini fragment lag screw fixation - two 2.7 x 12 mm screws and one 2.0 x 10 mm screw.  SURGEON:  Artist Pais. Mina Marble, M.D.  ASSISTANT:  Junius Roads. Ireton, P.A.C.  ANESTHESIA:  General.  TOURNIQUET TIME:  48 minutes.  COMPLICATIONS:  None.  DRAINS:  None.  DESCRIPTION:  Patient was taken to the operating where after the induction of adequate general anesthesia the left upper extremity was prepped and draped in usual sterile fashion.  An Esmarch was used to exsanguinate the limb and tourniquet was inflated to 250 mm Hg.  At this point in time a J-shaped incision was made over the dorsal aspect of the left hand with the J part of the limb going from ulnar to radial across the metacarpal phalangeal joint and the proximal part extended longitudinally.  Incision was taken down through the skin and subcutaneous tissues and a flap was elevated toward the thumb. The incision was then taken down to the area overlying the extensor tendons. The EIP and EDC to the index finger were then retracted to the midline and a subperiosteally dissection was undertaken.  A large fracture fragment was visualized.  This included a large ulnar-sided fragment with a large spike going to the base of the metacarpal on the ulnar side.  The fracture surface was debrided of clot and then reduction was performed using reduction clamps. At this point in time intraoperative x-ray showed good reduction both AP and lateral plane.  It was then fixed  with two 2.7 x 12 mm lag screws from doral to volar - one perpendicular to the long axis of the bone, one perpendicular to the fracture line.  A third screw was placed from radial to ulnar across the shaft of the bone to secure the proximal spike; this was a 2.0 mm screw x 10 mm.  Intraoperative x-ray showed good reduction of both the AP and lateral plane.  The wound was then irrigated.  The periosteum was closed with 4-0 Vicryl and the skin with a running 3-0 Prolene subcuticular stitch. Steri-Strips, 4x4s, fluffs, and a compressive dressing with a volar splint were applied.  The patient tolerated the procedure well, went to recovery room in stable fashion. DD:  11/08/00 TD:  11/08/00 Job: [id number]
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
## 2010-09-29 ENCOUNTER — Encounter: Payer: Self-pay | Admitting: Internal Medicine

## 2010-10-01 ENCOUNTER — Ambulatory Visit (INDEPENDENT_AMBULATORY_CARE_PROVIDER_SITE_OTHER): Payer: Self-pay | Admitting: *Deleted

## 2010-10-01 DIAGNOSIS — I428 Other cardiomyopathies: Secondary | ICD-10-CM

## 2010-10-01 DIAGNOSIS — I5022 Chronic systolic (congestive) heart failure: Secondary | ICD-10-CM

## 2010-10-01 NOTE — Progress Notes (Signed)
icd check

## 2010-10-20 ENCOUNTER — Ambulatory Visit: Payer: Self-pay | Admitting: Internal Medicine

## 2010-11-13 HISTORY — PX: CLOSED REDUCTION NASAL FRACTURE: SUR256

## 2010-12-15 ENCOUNTER — Ambulatory Visit (INDEPENDENT_AMBULATORY_CARE_PROVIDER_SITE_OTHER): Payer: Self-pay | Admitting: Internal Medicine

## 2010-12-15 ENCOUNTER — Encounter: Payer: Self-pay | Admitting: Internal Medicine

## 2010-12-15 VITALS — BP 162/94 | HR 53 | Ht 70.5 in | Wt 213.0 lb

## 2010-12-15 DIAGNOSIS — I10 Essential (primary) hypertension: Secondary | ICD-10-CM

## 2010-12-15 DIAGNOSIS — E785 Hyperlipidemia, unspecified: Secondary | ICD-10-CM | POA: Insufficient documentation

## 2010-12-15 DIAGNOSIS — I5022 Chronic systolic (congestive) heart failure: Secondary | ICD-10-CM

## 2010-12-15 MED ORDER — VALSARTAN 80 MG PO TABS: 80.0000 mg | ORAL_TABLET | Freq: Two times a day (BID) | ORAL | Status: DC

## 2010-12-15 NOTE — Assessment & Plan Note (Signed)
BP markedly elevated. Increase valsartan to 80 bid.

## 2010-12-15 NOTE — Patient Instructions (Signed)
Increase Diovan (Valsartan) to 80 mg Twice daily   Your physician recommends that you return for a FASTING lipid, liver, bmet, bnp, tsh in 2 weeks (428.22, 272.2)  Your physician has requested that you have an echocardiogram. Echocardiography is a painless test that uses sound waves to create images of your heart. It provides your doctor with information about the size and shape of your heart and how well your heart's chambers and valves are working. This procedure takes approximately one hour. There are no restrictions for this procedure.  In 2 weeks.  Your physician recommends that you schedule a follow-up appointment in: 1 month in Heart Failure Clinic

## 2010-12-15 NOTE — Assessment & Plan Note (Signed)
TGs markedly elevated on last check. However he has lost weight. Will recheck. May need fish oil.

## 2010-12-15 NOTE — Progress Notes (Signed)
HPI:  [name] Harrington is a 45 year old male with h/o CHF due to non-ischemic CM EF 20-25% s/p ICD placement. He also has h/o VT, chronic LBBB and ETOH abuse. Had angiodedema with ACE-I. Has been following with Dr. Johney Frame in EP. Returns today for f/u in HF clinic. (we have not seen him in clinic since 2010)  Was previously on amiodarone for his VT but this was stopped by Dr. Johney Frame.Currently working Chief of Staff at [employer] No CP or SOB. Has lost nearly 20 pounds with diet. Not exercising as frequently. No edema, orthopnea or PND. Has cut ETOH intake way back. No ETOH since July 4. He is worried about drinking more when football season starts.    ROS: All systems negative except as listed in HPI, PMH and Problem List.  Past Medical History  Diagnosis Date  . Alcohol abuse   . Ventricular tachycardia     a. cath 10/22/2008- NL Cors. EF 25% b. s/p St.  Jude current + DR dual chamber AICD 10/22/2008  . CHF (congestive heart failure)     a. 09/2008 echo: EF 25%  . Palpitations     afib  . Pre-syncope   . Gout   . LBBB (left bundle branch block)     Current Outpatient Prescriptions  Medication Sig Dispense Refill  . allopurinol (ZYLOPRIM) 300 MG tablet Take 300 mg by mouth daily.        . carvedilol (COREG) 6.25 MG tablet Take 6.25 mg by mouth 2 (two) times daily with a meal.        . omeprazole (PRILOSEC) 20 MG capsule Take 20 mg by mouth daily.        . valsartan (DIOVAN) 40 MG tablet Take 40 mg by mouth daily.           PHYSICAL EXAM: Filed Vitals:   12/15/10 1635  BP: 162/94  Pulse: 53   General:  Well appearing. No resp difficulty HEENT: normal Neck: supple. JVP flat. Carotids 2+ bilaterally; no bruits. No lymphadenopathy or thryomegaly appreciated. Cor: PMI normal. Distant. Regular rate & rhythm. No rubs, gallops or murmurs. Lungs: clear Abdomen: obese soft, nontender, nondistended. No hepatosplenomegaly. No bruits or masses. Good bowel sounds. Extremities: no cyanosis, clubbing,  rash, edema Neuro: alert & orientedx3, cranial nerves grossly intact. Moves all 4 extremities w/o difficulty. Affect pleasant.    ECG:  A. paced 53 LBBB   ASSESSMENT & PLAN:

## 2010-12-15 NOTE — Assessment & Plan Note (Addendum)
NYHA I. Volume status looks good. Will repeat echo to assess for any recovery in LV function. Increase valsartan to 80 bid. Discussed need to remain abstinent from ETOH and control BP. Check labs.

## 2010-12-31 ENCOUNTER — Other Ambulatory Visit (HOSPITAL_COMMUNITY): Payer: Self-pay | Admitting: Radiology

## 2010-12-31 ENCOUNTER — Other Ambulatory Visit: Payer: Self-pay | Admitting: *Deleted

## 2011-01-05 ENCOUNTER — Encounter: Payer: Self-pay | Admitting: *Deleted

## 2011-01-08 ENCOUNTER — Ambulatory Visit (INDEPENDENT_AMBULATORY_CARE_PROVIDER_SITE_OTHER): Payer: Self-pay | Admitting: *Deleted

## 2011-01-08 ENCOUNTER — Encounter: Payer: Self-pay | Admitting: Internal Medicine

## 2011-01-08 DIAGNOSIS — I472 Ventricular tachycardia, unspecified: Secondary | ICD-10-CM

## 2011-01-08 DIAGNOSIS — I4891 Unspecified atrial fibrillation: Secondary | ICD-10-CM

## 2011-01-08 DIAGNOSIS — I5022 Chronic systolic (congestive) heart failure: Secondary | ICD-10-CM

## 2011-01-08 DIAGNOSIS — I428 Other cardiomyopathies: Secondary | ICD-10-CM

## 2011-01-08 LAB — ICD DEVICE OBSERVATION
AL THRESHOLD: 0.75 V
ATRIAL PACING ICD: 4.4 pct
BATTERY VOLTAGE: 3.1584 V
DEVICE MODEL ICD: 475704
FVT: 0
HV IMPEDENCE: 44 Ohm
PACEART VT: 0
RV LEAD AMPLITUDE: 12 mv
TOT-0009: 2
TOT-0010: 17
TZON-0010SLOWVT: 80 ms
TZST-0001SLOWVT: 2

## 2011-01-08 NOTE — Progress Notes (Signed)
icd check in clinic  

## 2011-01-14 ENCOUNTER — Other Ambulatory Visit (HOSPITAL_COMMUNITY): Payer: Self-pay | Admitting: Radiology

## 2011-01-14 ENCOUNTER — Other Ambulatory Visit: Payer: Self-pay | Admitting: *Deleted

## 2011-01-15 ENCOUNTER — Encounter (HOSPITAL_COMMUNITY): Payer: Self-pay

## 2011-01-20 ENCOUNTER — Encounter: Payer: Self-pay | Admitting: *Deleted

## 2011-01-23 ENCOUNTER — Ambulatory Visit (HOSPITAL_COMMUNITY): Payer: Self-pay | Attending: Cardiology | Admitting: Radiology

## 2011-01-23 ENCOUNTER — Other Ambulatory Visit: Payer: Self-pay | Admitting: Internal Medicine

## 2011-01-23 ENCOUNTER — Other Ambulatory Visit (INDEPENDENT_AMBULATORY_CARE_PROVIDER_SITE_OTHER): Payer: Self-pay | Admitting: *Deleted

## 2011-01-23 DIAGNOSIS — I5022 Chronic systolic (congestive) heart failure: Secondary | ICD-10-CM

## 2011-01-23 DIAGNOSIS — I080 Rheumatic disorders of both mitral and aortic valves: Secondary | ICD-10-CM | POA: Insufficient documentation

## 2011-01-23 DIAGNOSIS — I079 Rheumatic tricuspid valve disease, unspecified: Secondary | ICD-10-CM | POA: Insufficient documentation

## 2011-01-23 DIAGNOSIS — I10 Essential (primary) hypertension: Secondary | ICD-10-CM | POA: Insufficient documentation

## 2011-01-23 DIAGNOSIS — I509 Heart failure, unspecified: Secondary | ICD-10-CM | POA: Insufficient documentation

## 2011-01-23 LAB — HEPATIC FUNCTION PANEL
Albumin: 3.9 g/dL (ref 3.5–5.2)
Alkaline Phosphatase: 64 U/L (ref 39–117)

## 2011-01-23 LAB — BASIC METABOLIC PANEL
BUN: 23 mg/dL (ref 6–23)
GFR: 71.46 mL/min (ref >60.00)
Potassium: 5.3 mEq/L — ABNORMAL HIGH (ref 3.5–5.1)
Sodium: 138 mEq/L (ref 135–145)

## 2011-01-23 LAB — BRAIN NATRIURETIC PEPTIDE: Pro B Natriuretic peptide (BNP): 177 pg/mL — ABNORMAL HIGH (ref 0.0–100.0)

## 2011-01-23 LAB — LDL CHOLESTEROL, DIRECT: Direct LDL: 63 mg/dL

## 2011-01-23 LAB — LIPID PANEL: Triglycerides: 241 mg/dL — ABNORMAL HIGH (ref 0.0–149.0)

## 2011-01-23 LAB — TSH: TSH: 1.35 u[IU]/mL (ref 0.35–5.50)

## 2011-02-09 ENCOUNTER — Encounter (HOSPITAL_COMMUNITY): Payer: Self-pay

## 2011-02-26 ENCOUNTER — Encounter (HOSPITAL_COMMUNITY): Payer: Self-pay

## 2011-02-27 ENCOUNTER — Encounter (HOSPITAL_COMMUNITY): Payer: Self-pay | Admitting: *Deleted

## 2011-03-04 ENCOUNTER — Telehealth (HOSPITAL_COMMUNITY): Payer: Self-pay | Admitting: *Deleted

## 2011-03-04 NOTE — Telephone Encounter (Signed)
[name] Harrington called this afternoon stating that he received a letter in the mail regarding his recent echocardiogram and that he was to call you.  Please follow up.  Thanks

## 2011-03-04 NOTE — Telephone Encounter (Signed)
Pt aware of echo results 

## 2011-03-31 ENCOUNTER — Encounter (HOSPITAL_COMMUNITY): Payer: Self-pay

## 2011-03-31 ENCOUNTER — Ambulatory Visit (HOSPITAL_COMMUNITY)
Admission: RE | Admit: 2011-03-31 | Discharge: 2011-03-31 | Disposition: A | Discharge status: Home / Self Care | Payer: Self-pay | Source: Ambulatory Visit | Attending: Internal Medicine | Admitting: Internal Medicine

## 2011-03-31 VITALS — BP 130/84 | HR 67 | Wt 218.0 lb

## 2011-03-31 DIAGNOSIS — I5022 Chronic systolic (congestive) heart failure: Secondary | ICD-10-CM | POA: Insufficient documentation

## 2011-03-31 MED ORDER — CARVEDILOL 6.25 MG PO TABS: ORAL_TABLET | ORAL | Status: DC

## 2011-03-31 NOTE — Assessment & Plan Note (Addendum)
NYHA II. Volume status stable. Will increase Carvedilol 9.375mg  bid. Re-educated to weigh daily. Lengthy discussion about medication titration. Also discussed complete alcohol cessation for his heart recovery.   Follow up in 5 weeks.   Patient seen and examined with Tonye Becket, NP. We discussed all aspects of the encounter. I agree with the assessment and plan as stated above. Reinforced complete abstinence from ETOH. Will increase b-blocker.  Reviewed need for sliding scale diuretics.

## 2011-03-31 NOTE — Progress Notes (Signed)
Patient ID: [name] Harrington, male   DOB: 11-03-1965, 45 y.o.   MRN: [id number] HPI:  [name] Harrington is a 45 year old male with h/o CHF due to non-ischemic CM EF 20-25% s/p ICD placement. He also has h/o VT, chronic LBBB and ETOH abuse. Had angiodedema with ACE-I. Has been following with Dr. Johney Frame in EP. Returns today for f/u in HF clinic. (we have not seen him in clinic since 2010)  01/23/2011 ECHO EF 20-25% He is here for follow up .Weight at home 213. He does not weigh at home. Works full time as Actor. Taking all medications.  [facility]bt[facility]. Denies SOB/PND/Orthopnea. No edema. ETOH 1-2 beers daily. Denies smoking. Lower extremity edema with gout flares.   ROS: All systems negative except as listed in HPI, PMH and Problem List.  Past Medical History  Diagnosis Date  . Alcohol abuse   . Ventricular tachycardia     a. cath 10/22/2008- NL Cors. EF 25% b. s/p St.  Jude current + DR dual chamber AICD 10/22/2008  . CHF (congestive heart failure)     a. 09/2008 echo: EF 25%  . Palpitations     afib  . Pre-syncope   . Gout   . LBBB (left bundle branch block)     Current Outpatient Prescriptions  Medication Sig Dispense Refill  . allopurinol (ZYLOPRIM) 300 MG tablet Take 300 mg by mouth daily.        . carvedilol (COREG) 6.25 MG tablet Take 6.25 mg by mouth 2 (two) times daily with a meal.        . omeprazole (PRILOSEC) 20 MG capsule Take 20 mg by mouth daily.        . valsartan (DIOVAN) 80 MG tablet Take 1 tablet (80 mg total) by mouth 2 (two) times daily.  60 tablet  6     PHYSICAL EXAM: Filed Vitals:   03/31/11 1158  BP: 130/84  Pulse: 67   General:  Well appearing. No resp difficulty HEENT: normal Neck: supple. JVP flat. Carotids 2+ bilaterally; no bruits. No lymphadenopathy or thryomegaly appreciated. Cor: PMI normal. Distant. Regular rate & rhythm. No rubs, gallops or murmurs. Lungs: clear Abdomen: obese soft, nontender, nondistended. No  hepatosplenomegaly. No bruits or masses. Good bowel sounds. Extremities: no cyanosis, clubbing, rash, edema Neuro: alert & orientedx3, cranial nerves grossly intact. Moves all 4 extremities w/o difficulty. Affect pleasant.       ASSESSMENT & PLAN:

## 2011-03-31 NOTE — Patient Instructions (Addendum)
Follow up in 5 weeks.  Take Carvedilol 9.375 mg bid (1 1/2 tabs twice a day)  Do the following things EVERYDAY: 1) Weigh yourself in the morning before breakfast. Write it down and keep it in a log. 2) Take your medicines as prescribed 3) Eat low salt foods-Limit salt (sodium) to 2000mg  per day.  4) Stay as active as you can everyday

## 2011-04-02 ENCOUNTER — Encounter (HOSPITAL_COMMUNITY): Payer: Self-pay

## 2011-04-06 NOTE — Progress Notes (Signed)
Patient seen and examined with Amy Clegg, NP. We discussed all aspects of the encounter. I agree with the assessment and plan as stated below.   

## 2011-04-09 ENCOUNTER — Encounter: Payer: Self-pay | Admitting: *Deleted

## 2011-04-16 ENCOUNTER — Encounter: Payer: Self-pay | Admitting: *Deleted

## 2011-04-22 ENCOUNTER — Ambulatory Visit (INDEPENDENT_AMBULATORY_CARE_PROVIDER_SITE_OTHER): Payer: Self-pay | Admitting: *Deleted

## 2011-04-22 ENCOUNTER — Encounter: Payer: Self-pay | Admitting: Internal Medicine

## 2011-04-22 DIAGNOSIS — I5022 Chronic systolic (congestive) heart failure: Secondary | ICD-10-CM

## 2011-04-22 DIAGNOSIS — I428 Other cardiomyopathies: Secondary | ICD-10-CM

## 2011-04-22 DIAGNOSIS — I472 Ventricular tachycardia: Secondary | ICD-10-CM

## 2011-04-22 LAB — ICD DEVICE OBSERVATION
AL AMPLITUDE: 2.9 mv
AL THRESHOLD: 0.75 V
BATTERY VOLTAGE: 3.1133 V
DEVICE MODEL ICD: 475704
MODE SWITCH EPISODES: 0
PACEART VT: 0
RV LEAD AMPLITUDE: 12 mv
RV LEAD THRESHOLD: 1 V
TOT-0010: 18
TZAT-0001SLOWVT: 1
TZAT-0004SLOWVT: 8
TZAT-0019SLOWVT: 7.5 V
TZON-0010SLOWVT: 80 ms
TZST-0001SLOWVT: 3
TZST-0003SLOWVT: 36 J
VF: 0

## 2011-04-22 NOTE — Progress Notes (Signed)
ICD

## 2011-05-07 ENCOUNTER — Encounter (HOSPITAL_COMMUNITY): Payer: Self-pay

## 2011-05-18 ENCOUNTER — Ambulatory Visit (HOSPITAL_COMMUNITY): Payer: Self-pay

## 2011-05-25 ENCOUNTER — Ambulatory Visit (HOSPITAL_COMMUNITY)
Admission: RE | Admit: 2011-05-25 | Discharge: 2011-05-25 | Disposition: A | Discharge status: Home / Self Care | Payer: Self-pay | Source: Ambulatory Visit | Attending: Internal Medicine | Admitting: Internal Medicine

## 2011-05-25 VITALS — BP 136/92 | HR 71 | Wt 226.2 lb

## 2011-05-25 DIAGNOSIS — I5022 Chronic systolic (congestive) heart failure: Secondary | ICD-10-CM | POA: Insufficient documentation

## 2011-05-25 MED ORDER — CARVEDILOL 6.25 MG PO TABS: 12.5000 mg | ORAL_TABLET | Freq: Two times a day (BID) | ORAL | Status: DC

## 2011-05-25 MED ORDER — HYDROCHLOROTHIAZIDE 12.5 MG PO TABS: 12.5000 mg | ORAL_TABLET | Freq: Every day | ORAL | Status: DC

## 2011-05-25 NOTE — Patient Instructions (Addendum)
Will add hydrochlorothiazide 12.5 mg daily.  Increase carvedilol 9.375 mg (2 tabs) twice daily.  Your physician has recommended that you have a cardiopulmonary stress test (CPX). CPX testing is a non-invasive measurement of heart and lung function. It replaces a traditional treadmill stress test. This type of test provides a tremendous amount of information that relates not only to your present condition but also for future outcomes. This test combines measurements of you ventilation, respiratory gas exchange in the lungs, electrocardiogram (EKG), blood pressure and physical response before, during, and following an exercise protocol.  We will call when we schedule CPX.    Follow up in 4 weeks.    Do the following things EVERYDAY: 1) Weigh yourself in the morning before breakfast. Write it down and keep it in a log. 2) Take your medicines as prescribed 3) Eat low salt foods-Limit salt (sodium) to 2000mg  per day.  4) Stay as active as you can everyday

## 2011-05-25 NOTE — Assessment & Plan Note (Addendum)
Volume status mildly elevated.  NYHA II.  Will add hydrochlorothiazide 12.5 mg to help with mild volume overload and hypertension.  Scale given today for daily weights.  He will start recording daily.  Will need BMET.  Will schedule CPX to evaluate functional status as EF remains 20-25%.    Patient seen and examined with Ulyess Blossom PA-C. We discussed all aspects of the encounter. I agree with the assessment and plan as stated above.  On good regimen. Does have mild volume overload. Will add HCTZ to help with volume and BP. I suspect he is overstating his functional capacity and will get CPX for more objective assessment and risk stratification. Reinforced need for daily weights and reviewed use of sliding scale diuretics.

## 2011-05-25 NOTE — Assessment & Plan Note (Signed)
Add HCTZ, as above.

## 2011-05-25 NOTE — Progress Notes (Signed)
HPI:  [name] Harrington is a 46 year old male with h/o CHF due to non-ischemic CM EF 20-25% s/p ICD placement. He also has h/o VT, chronic LBBB and ETOH abuse. Had angiodedema with ACE-I. Has been following with Dr. Johney Frame in EP.   01/23/2011 ECHO EF 20-25%  He returns for follow up today.  He is c/o HA today no visual changes.  No dizziness or syncope.  He had a touch of the flu 2-3 weeks ago and had increased .   Weight up this visit.  No orthopnea/PND.  Going up hills.    He is here for follow up .Weight at home 213. He does not weigh at home. Works full time as Actor. Taking all medications.  [facility] Denies SOB/PND/Orthopnea. No edema. ETOH 1-2 beers daily. Denies smoking. Lower extremity edema with gout flares. ICD has not fired.    ROS: All systems negative except as listed in HPI, PMH and Problem List.  Past Medical History  Diagnosis Date  . Alcohol abuse   . Ventricular tachycardia     a. cath 10/22/2008- NL Cors. EF 25% b. s/p St.  Jude current + DR dual chamber AICD 10/22/2008  . CHF (congestive heart failure)     a. 09/2008 echo: EF 25%  . Palpitations     afib  . Pre-syncope   . Gout   . LBBB (left bundle branch block)     Current Outpatient Prescriptions  Medication Sig Dispense Refill  . allopurinol (ZYLOPRIM) 300 MG tablet Take 300 mg by mouth daily.        . carvedilol (COREG) 6.25 MG tablet Take 1 1/2 tabs twice a day  90 tablet  6  . omeprazole (PRILOSEC) 20 MG capsule Take 20 mg by mouth daily.        . valsartan (DIOVAN) 80 MG tablet Take 1 tablet (80 mg total) by mouth 2 (two) times daily.  60 tablet  6    PHYSICAL EXAM: Filed Vitals:   05/25/11 1523  BP: 136/92  Pulse: 71  Weight: 226 lb 4 oz (102.626 kg)  SpO2: 98%   General:  Well appearing. No resp difficulty HEENT: normal Neck: supple. JVP 6-7. Carotids 2+ bilaterally; no bruits. No lymphadenopathy or thryomegaly appreciated. Cor: PMI normal. Distant. Regular rate &  rhythm. No rubs, gallops or murmurs. Lungs: clear Abdomen: obese soft, nontender, nondistended. No hepatosplenomegaly. No bruits or masses. Good bowel sounds. Extremities: no cyanosis, clubbing, rash, edema Neuro: alert & orientedx3, cranial nerves grossly intact. Moves all 4 extremities w/o difficulty. Affect pleasant.    ASSESSMENT & PLAN:

## 2011-06-24 ENCOUNTER — Encounter (HOSPITAL_COMMUNITY): Payer: Self-pay

## 2011-06-29 ENCOUNTER — Encounter (HOSPITAL_COMMUNITY): Payer: Self-pay

## 2011-07-06 ENCOUNTER — Ambulatory Visit (HOSPITAL_COMMUNITY): Payer: Self-pay | Attending: Physician Assistant

## 2011-07-06 DIAGNOSIS — I5022 Chronic systolic (congestive) heart failure: Secondary | ICD-10-CM

## 2011-07-20 ENCOUNTER — Ambulatory Visit (HOSPITAL_COMMUNITY)
Admission: RE | Admit: 2011-07-20 | Discharge: 2011-07-20 | Disposition: A | Discharge status: Home / Self Care | Payer: Self-pay | Source: Ambulatory Visit | Attending: Internal Medicine | Admitting: Internal Medicine

## 2011-07-20 VITALS — BP 120/78 | HR 71 | Wt 215.0 lb

## 2011-07-20 DIAGNOSIS — I10 Essential (primary) hypertension: Secondary | ICD-10-CM | POA: Insufficient documentation

## 2011-07-20 DIAGNOSIS — I5022 Chronic systolic (congestive) heart failure: Secondary | ICD-10-CM | POA: Insufficient documentation

## 2011-07-20 NOTE — Assessment & Plan Note (Signed)
Blood pressure well controlled. Continue current regimen.  

## 2011-07-20 NOTE — Assessment & Plan Note (Signed)
Doing great NYHA I-II. CPX test is primarily lung limited in the setting of moderate obstructive lung disease. Will continue current meds. Give lasix to [place] prn. Reinforced need for daily weights and reviewed use of sliding scale diuretics. See back in 6 weeks with likely titration of Valsartan. Congratulated him on his compliance.

## 2011-07-20 NOTE — Progress Notes (Signed)
Patient ID: [name] Harrington, male   DOB: 03-26-1966, 46 y.o.   MRN: [id number] HPI:  [name] Harrington is a 46 year old male with h/o CHF due to non-ischemic CM EF 20-25% s/p ICD placement. He also has h/o VT, chronic LBBB and ETOH abuse. Had angiodedema with ACE-I. Has been following with Dr. Johney Frame in EP.   01/23/2011 ECHO EF 20-25%  We saw him last month and had some volume overload and NYHA II-III sx. Prescribed HCTZ 12.5 but he didn't fill.  CPX test 3/13:   FEV1/FVC 60%  pVO2 = 23.4 (71%) - not corrected for weight Slope 26 RER 0.98 Ve/MVV = 74% O2 pulse 100%  Feeling much better. Walking a couple of miles a day. Only gets SOB with long hills. Being much more cautious about diet. Weighing himself everyday. No edema, orthopnea or PND. Weight down 13 pounds. Compliant with meds. Getting them through [facility]. Due for refills in 2 months. [facility] following lipids.   ROS: All systems negative except as listed in HPI, PMH and Problem List.  Past Medical History  Diagnosis Date  . Alcohol abuse   . Ventricular tachycardia     a. cath 10/22/2008- NL Cors. EF 25% b. s/p St.  Jude current + DR dual chamber AICD 10/22/2008  . CHF (congestive heart failure)     a. 09/2008 echo: EF 25%  . Palpitations     afib  . Pre-syncope   . Gout   . LBBB (left bundle branch block)     Current Outpatient Prescriptions  Medication Sig Dispense Refill  . allopurinol (ZYLOPRIM) 300 MG tablet Take 300 mg by mouth daily.        . carvedilol (COREG) 6.25 MG tablet Take 12.5 mg by mouth 2 (two) times daily with a meal.      . omeprazole (PRILOSEC) 20 MG capsule Take 20 mg by mouth daily.        . valsartan (DIOVAN) 80 MG tablet Take 1 tablet (80 mg total) by mouth 2 (two) times daily.  60 tablet  6  . hydrochlorothiazide (HYDRODIURIL) 12.5 MG tablet Take 1 tablet (12.5 mg total) by mouth daily.  30 tablet  6    PHYSICAL EXAM: Filed Vitals:   07/20/11 0932  BP: 120/78  Pulse: 71  Weight: 215 lb (97.523  kg)  SpO2: 99%   General:  Well appearing. No resp difficulty HEENT: normal Neck: supple. JVP flat. Carotids 2+ bilaterally; no bruits. No lymphadenopathy or thryomegaly appreciated. Cor: PMI normal. Distant. Regular rate & rhythm. No rubs, gallops or murmurs. Lungs: clear with decreased BS throughout. No wheeze.  Abdomen: obese soft, nontender, nondistended. No hepatosplenomegaly. No bruits or masses. Good bowel sounds. Extremities: no cyanosis, clubbing, rash, edema Neuro: alert & orientedx3, cranial nerves grossly intact. Moves all 4 extremities w/o difficulty. Affect pleasant.    ASSESSMENT & PLAN:

## 2011-07-20 NOTE — Patient Instructions (Signed)
You have been referred to Pulmonary for Asthma  Your physician recommends that you schedule a follow-up appointment in: 6 weeks

## 2011-07-20 NOTE — Progress Notes (Signed)
Encounter addended by: Noralee Space, RN on: 07/20/2011 10:37 AM<BR>     Documentation filed: Patient Instructions Section

## 2011-07-23 ENCOUNTER — Encounter: Payer: Self-pay | Admitting: Internal Medicine

## 2011-09-07 ENCOUNTER — Institutional Professional Consult (permissible substitution): Payer: Self-pay | Admitting: Pulmonary Disease

## 2011-09-07 ENCOUNTER — Encounter (HOSPITAL_COMMUNITY): Payer: Self-pay

## 2011-09-17 ENCOUNTER — Ambulatory Visit (HOSPITAL_COMMUNITY)
Admission: RE | Admit: 2011-09-17 | Discharge: 2011-09-17 | Disposition: A | Discharge status: Home / Self Care | Payer: Self-pay | Source: Ambulatory Visit | Attending: Internal Medicine | Admitting: Internal Medicine

## 2011-09-17 ENCOUNTER — Encounter (HOSPITAL_COMMUNITY): Payer: Self-pay

## 2011-09-17 VITALS — BP 120/90 | HR 78 | Ht 70.0 in | Wt 207.8 lb

## 2011-09-17 DIAGNOSIS — I509 Heart failure, unspecified: Secondary | ICD-10-CM | POA: Insufficient documentation

## 2011-09-17 DIAGNOSIS — Z95810 Presence of automatic (implantable) cardiac defibrillator: Secondary | ICD-10-CM | POA: Insufficient documentation

## 2011-09-17 DIAGNOSIS — I428 Other cardiomyopathies: Secondary | ICD-10-CM | POA: Insufficient documentation

## 2011-09-17 DIAGNOSIS — Z79899 Other long term (current) drug therapy: Secondary | ICD-10-CM | POA: Insufficient documentation

## 2011-09-17 DIAGNOSIS — R0989 Other specified symptoms and signs involving the circulatory and respiratory systems: Secondary | ICD-10-CM | POA: Insufficient documentation

## 2011-09-17 DIAGNOSIS — F1010 Alcohol abuse, uncomplicated: Secondary | ICD-10-CM | POA: Insufficient documentation

## 2011-09-17 DIAGNOSIS — I447 Left bundle-branch block, unspecified: Secondary | ICD-10-CM | POA: Insufficient documentation

## 2011-09-17 DIAGNOSIS — R0609 Other forms of dyspnea: Secondary | ICD-10-CM | POA: Insufficient documentation

## 2011-09-17 DIAGNOSIS — I5022 Chronic systolic (congestive) heart failure: Secondary | ICD-10-CM

## 2011-09-17 MED ORDER — CARVEDILOL 12.5 MG PO TABS: 18.7500 mg | ORAL_TABLET | Freq: Two times a day (BID) | ORAL | Status: DC

## 2011-09-17 NOTE — Assessment & Plan Note (Signed)
Doing great NYHA I-II. Much improved. Volume status looks good. Will increase carvedilol to 18.75 bid. Reinforced need for daily weights and reviewed use of sliding scale diuretics. Will check echo to look for LV recovery. We discussed ICD again and he continues to refuse.

## 2011-09-17 NOTE — Patient Instructions (Signed)
Increase Carvedilol to 18.75 mg (1 & 1/2 tabs) daily  Your physician has requested that you have an echocardiogram. Echocardiography is a painless test that uses sound waves to create images of your heart. It provides your doctor with information about the size and shape of your heart and how well your heart's chambers and valves are working. This procedure takes approximately one hour. There are no restrictions for this procedure.  Your physician recommends that you schedule a follow-up appointment in: 1 month

## 2011-09-17 NOTE — Progress Notes (Signed)
Patient ID: Thayne Cindric, male   DOB: 1965/09/20, 46 y.o.   MRN: [id number]  HPI:  Crystal is a 46 year old male with h/o CHF due to non-ischemic CM EF 20-25% s/p ICD placement. He also has h/o VT, chronic LBBB and ETOH abuse. Had angiodedema with ACE-I. Has been following with Dr. Johney Frame in EP.   01/23/2011 ECHO EF 20-25%  CPX test 3/13:   FEV1/FVC 60%  pVO2 = 23.4 (71%) - not corrected for weight Slope 26 RER 0.98 Ve/MVV = 74% (lung limited) O2 pulse 100%  Feeling much better. Working at [employer] and can do almost all activities without any dyspnea. Does get mild DOE when going up hills. Has scale now and weighing ever day. Weight today 207. (down 8 pounds from previous). No orthopnea/PND/edema. Compliant with all meds. Not needing any lasix.  [facility] following lipids.   ROS: All systems negative except as listed in HPI, PMH and Problem List.  Past Medical History  Diagnosis Date  . Alcohol abuse   . Ventricular tachycardia     a. cath 10/22/2008- NL Cors. EF 25% b. s/p St.  Jude current + DR dual chamber AICD 10/22/2008  . CHF (congestive heart failure)     a. 09/2008 echo: EF 25%  . Palpitations     afib  . Pre-syncope   . Gout   . LBBB (left bundle branch block)     Current Outpatient Prescriptions  Medication Sig Dispense Refill  . allopurinol (ZYLOPRIM) 300 MG tablet Take 300 mg by mouth daily.        . carvedilol (COREG) 6.25 MG tablet Take 12.5 mg by mouth 2 (two) times daily with a meal.      . hydrochlorothiazide (HYDRODIURIL) 12.5 MG tablet Take 1 tablet (12.5 mg total) by mouth daily.  30 tablet  6  . omeprazole (PRILOSEC) 20 MG capsule Take 20 mg by mouth daily.        . valsartan (DIOVAN) 80 MG tablet Take 1 tablet (80 mg total) by mouth 2 (two) times daily.  60 tablet  6    PHYSICAL EXAM: Filed Vitals:   09/17/11 1503  BP: 120/90  Pulse: 78  Height: 5\' 10"  (1.778 m)  Weight: 207 lb 12.8 oz (94.257 kg)   General:  Well appearing. No resp  difficulty HEENT: normal Neck: supple. JVP flat. Carotids 2+ bilaterally; no bruits. No lymphadenopathy or thryomegaly appreciated. Cor: PMI normal. Distant. Regular rate & rhythm. No rubs, gallops or murmurs. Lungs: clear with decreased BS throughout. No wheeze.  Abdomen: obese soft, nontender, nondistended. No hepatosplenomegaly. No bruits or masses. Good bowel sounds. Extremities: no cyanosis, clubbing, rash, edema Neuro: alert & orientedx3, cranial nerves grossly intact. Moves all 4 extremities w/o difficulty. Affect pleasant.    ASSESSMENT & PLAN:

## 2011-09-17 NOTE — Progress Notes (Signed)
Encounter addended by: Noralee Space, RN on: 09/17/2011  4:07 PM<BR>     Documentation filed: Patient Instructions Section, Orders

## 2011-09-28 ENCOUNTER — Institutional Professional Consult (permissible substitution): Payer: Self-pay | Admitting: Pulmonary Disease

## 2011-10-22 ENCOUNTER — Ambulatory Visit (HOSPITAL_COMMUNITY)
Admission: RE | Admit: 2011-10-22 | Discharge: 2011-10-22 | Disposition: A | Discharge status: Home / Self Care | Payer: Self-pay | Source: Ambulatory Visit | Attending: Internal Medicine | Admitting: Internal Medicine

## 2011-10-22 ENCOUNTER — Encounter (HOSPITAL_COMMUNITY): Payer: Self-pay

## 2011-10-22 VITALS — BP 132/86 | HR 72 | Wt 205.4 lb

## 2011-10-22 DIAGNOSIS — R002 Palpitations: Secondary | ICD-10-CM | POA: Insufficient documentation

## 2011-10-22 DIAGNOSIS — I359 Nonrheumatic aortic valve disorder, unspecified: Secondary | ICD-10-CM | POA: Insufficient documentation

## 2011-10-22 DIAGNOSIS — I509 Heart failure, unspecified: Secondary | ICD-10-CM | POA: Insufficient documentation

## 2011-10-22 DIAGNOSIS — Z950 Presence of cardiac pacemaker: Secondary | ICD-10-CM | POA: Insufficient documentation

## 2011-10-22 DIAGNOSIS — R55 Syncope and collapse: Secondary | ICD-10-CM | POA: Insufficient documentation

## 2011-10-22 DIAGNOSIS — I428 Other cardiomyopathies: Secondary | ICD-10-CM | POA: Insufficient documentation

## 2011-10-22 DIAGNOSIS — I4729 Other ventricular tachycardia: Secondary | ICD-10-CM | POA: Insufficient documentation

## 2011-10-22 DIAGNOSIS — F1010 Alcohol abuse, uncomplicated: Secondary | ICD-10-CM | POA: Insufficient documentation

## 2011-10-22 DIAGNOSIS — I5022 Chronic systolic (congestive) heart failure: Secondary | ICD-10-CM

## 2011-10-22 DIAGNOSIS — I472 Ventricular tachycardia, unspecified: Secondary | ICD-10-CM | POA: Insufficient documentation

## 2011-10-22 DIAGNOSIS — Z95810 Presence of automatic (implantable) cardiac defibrillator: Secondary | ICD-10-CM | POA: Insufficient documentation

## 2011-10-22 DIAGNOSIS — I447 Left bundle-branch block, unspecified: Secondary | ICD-10-CM | POA: Insufficient documentation

## 2011-10-22 DIAGNOSIS — M109 Gout, unspecified: Secondary | ICD-10-CM | POA: Insufficient documentation

## 2011-10-22 MED ORDER — CARVEDILOL 25 MG PO TABS: 25.0000 mg | ORAL_TABLET | Freq: Two times a day (BID) | ORAL | Status: DC

## 2011-10-22 NOTE — Assessment & Plan Note (Signed)
Volume status stable. ECHO reviewed during office visit. EF remains 25%. No improvement noted. Increase Carvedilol 25 mg twice a day. He will continue to take Lasix as needed for a 3 pound weight. Next visit will consider increasing ARB. Not on an Ace Inhibitor due to angioedema. Follow up in 3 weeks.

## 2011-10-22 NOTE — Assessment & Plan Note (Signed)
Stable  Continue current regimen  

## 2011-10-22 NOTE — Progress Notes (Signed)
Patient ID: [name] Harrington, male   DOB: [birth year], 46 y.o.   MRN: [id number] HPI:  [name] Harrington is a 46 year old male with h/o CHF due to non-ischemic CM EF 20-25% s/p ICD placement (2010). He also has h/o VT, chronic LBBB and ETOH abuse. Had angiodedema with ACE-I. Has been following with Dr. Johney Frame in EP.   01/23/2011 ECHO EF 20-25%  CPX test 3/13:  FEV1/FVC 60%  pVO2 = 23.4 (71%) - not corrected for weight Slope 26 RER 0.98 Ve/MVV = 74% (lung limited) O2 pulse 100%  10/22/11 ECHO EF 25 %  He returns for follow up. Last visit carvedilol increased 18.75 mg twice a day. Gout improved on allopurinol. Denies SOB/PND/Orthopnea. Weight at home 207 pounds. Complaint with medications. He has difficulty paying for medications. 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   ROS: All systems negative except as listed in HPI, PMH and Problem List.  Past Medical History  Diagnosis Date  . Alcohol abuse   . Ventricular tachycardia     a. cath 10/22/2008- NL Cors. EF 25% b. s/p St.  Jude current + DR dual chamber AICD 10/22/2008  . CHF (congestive heart failure)     a. 09/2008 echo: EF 25%  . Palpitations     afib  . Pre-syncope   . Gout   . LBBB (left bundle branch block)     Current Outpatient Prescriptions  Medication Sig Dispense Refill  . allopurinol (ZYLOPRIM) 300 MG tablet Take 300 mg by mouth daily.        . carvedilol (COREG) 12.5 MG tablet Take 1.5 tablets (18.75 mg total) by mouth 2 (two) times daily with a meal.      . hydrochlorothiazide (HYDRODIURIL) 12.5 MG tablet Take 1 tablet (12.5 mg total) by mouth daily.  30 tablet  6  . omeprazole (PRILOSEC) 20 MG capsule Take 20 mg by mouth daily.        . valsartan (DIOVAN) 80 MG tablet Take 1 tablet (80 mg total) by mouth 2 (two) times daily.  60 tablet  6    PHYSICAL EXAM: Filed Vitals:   10/22/11 1606  BP: 132/86  Pulse: 72  Weight: 205 lb 6.4 oz (93.169 kg)  SpO2: 99%   General:  Well appearing. No resp difficulty HEENT:  normal Neck: supple. JVP flat. Carotids 2+ bilaterally; no bruits. No lymphadenopathy or thryomegaly appreciated. Cor: PMI normal. Distant. Regular rate & rhythm. No rubs, gallops or murmurs. Lungs: clear with decreased BS throughout. No wheeze.  Abdomen: obese soft, nontender, nondistended. No hepatosplenomegaly. No bruits or masses. Good bowel sounds. Extremities: no cyanosis, clubbing, rash, edema Neuro: alert & orientedx3, cranial nerves grossly intact. Moves all 4 extremities w/o difficulty. Affect pleasant.    ASSESSMENT & PLAN:

## 2011-10-22 NOTE — Patient Instructions (Addendum)
Take Carvedilol 25 mg twice a day  Follow up in 3 weeks  Do the following things EVERYDAY: 1) Weigh yourself in the morning before breakfast. Write it down and keep it in a log. 2) Take your medicines as prescribed 3) Eat low salt foods--Limit salt (sodium) to 2000 mg per day.  4) Stay as active as you can everyday 5) Limit all fluids for the day to less than 2 liters

## 2011-10-22 NOTE — Progress Notes (Signed)
  Echocardiogram 2D Echocardiogram has been performed.  [name] Harrington 10/22/2011, 4:04 PM

## 2011-11-17 ENCOUNTER — Encounter (HOSPITAL_COMMUNITY): Payer: Self-pay

## 2011-12-01 ENCOUNTER — Ambulatory Visit (HOSPITAL_COMMUNITY)
Admission: RE | Admit: 2011-12-01 | Discharge: 2011-12-01 | Disposition: A | Discharge status: Home / Self Care | Payer: Self-pay | Source: Ambulatory Visit | Attending: Internal Medicine | Admitting: Internal Medicine

## 2011-12-01 VITALS — BP 122/78 | HR 64 | Wt 204.5 lb

## 2011-12-01 DIAGNOSIS — I5022 Chronic systolic (congestive) heart failure: Secondary | ICD-10-CM | POA: Insufficient documentation

## 2011-12-01 LAB — BASIC METABOLIC PANEL
BUN: 22 mg/dL (ref 6–23)
CO2: 28 mEq/L (ref 19–32)
Chloride: 100 mEq/L (ref 96–112)
Creatinine, Ser: 1.05 mg/dL (ref 0.50–1.35)

## 2011-12-01 MED ORDER — LOSARTAN POTASSIUM 50 MG PO TABS: 50.0000 mg | ORAL_TABLET | Freq: Every day | ORAL | Status: DC

## 2011-12-01 MED ORDER — FUROSEMIDE 40 MG PO TABS: 40.0000 mg | ORAL_TABLET | ORAL | Status: DC | PRN

## 2011-12-01 MED ORDER — CARVEDILOL 25 MG PO TABS: 25.0000 mg | ORAL_TABLET | Freq: Two times a day (BID) | ORAL | Status: DC

## 2011-12-01 NOTE — Progress Notes (Signed)
Patient ID: [name] Harrington, male   DOB: 02-09-1966, 46 y.o.   MRN: [id number] HPI: [name] Harrington is a 46 year old male with h/o CHF due to non-ischemic CM EF 20-25% s/p ICD placement (2010). He also has h/o VT, chronic LBBB and ETOH abuse. Had angiodedema with ACE-I. Has been following with Dr. Johney Frame in EP.   01/23/2011 ECHO EF 20-25%  CPX test 3/13:  FEV1/FVC 60%  pVO2 = 23.4 (71%) - not corrected for weight Slope 26 RER 0.98 Ve/MVV = 74% (lung limited) O2 pulse 100%  10/22/11 ECHO EF 25 %  He returns for follow up. Gout flare in July that resolved with prednisone. Last visit carvedilol increased 25 mg twice a day. Denies SOB/PND/Orthopnea/dizziness. Weight at home 204 pounds. Takes  He has difficulty paying for medications.He was followed by [facility] but he was notified it will be closed per [facility] 12/25/11.  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 Stopped smoking over a year ago.   ROS: All systems negative except as listed in HPI, PMH and Problem List.  Past Medical History  Diagnosis Date  . Alcohol abuse   . Ventricular tachycardia     a. cath 10/22/2008- NL Cors. EF 25% b. s/p St.  Jude current + DR dual chamber AICD 10/22/2008  . CHF (congestive heart failure)     a. 09/2008 echo: EF 25%  . Palpitations     afib  . Pre-syncope   . Gout   . LBBB (left bundle branch block)     Current Outpatient Prescriptions  Medication Sig Dispense Refill  . allopurinol (ZYLOPRIM) 300 MG tablet Take 300 mg by mouth daily.        . carvedilol (COREG) 25 MG tablet Take 1 tablet (25 mg total) by mouth 2 (two) times daily with a meal.  60 tablet  3  . furosemide (LASIX) 40 MG tablet Take 40 mg by mouth as needed.      . hydrochlorothiazide (HYDRODIURIL) 12.5 MG tablet Take 1 tablet (12.5 mg total) by mouth daily.  30 tablet  6  . omeprazole (PRILOSEC) 20 MG capsule Take 20 mg by mouth daily.        . valsartan (DIOVAN) 80 MG tablet Take 1 tablet (80 mg total) by mouth 2 (two) times  daily.  60 tablet  6    PHYSICAL EXAM: Filed Vitals:   12/01/11 1012  BP: 122/78  Pulse: 64  Weight: 204 lb 8 oz (92.761 kg)  SpO2: 99%   General:  Well appearing. No resp difficulty HEENT: normal Neck: supple. JVP flat. Carotids 2+ bilaterally; no bruits. No lymphadenopathy or thryomegaly appreciated. Cor: PMI normal. Distant. Regular rate & rhythm. No rubs, gallops or murmurs. Lungs: clear with decreased BS throughout. No wheeze.  Abdomen: obese soft, nontender, nondistended. No hepatosplenomegaly. No bruits or masses. Good bowel sounds. Extremities: no cyanosis, clubbing, rash, edema Neuro: alert & orientedx3, cranial nerves grossly intact. Moves all 4 extremities w/o difficulty. Affect pleasant.    ASSESSMENT & PLAN:

## 2011-12-01 NOTE — Assessment & Plan Note (Addendum)
Volume status stable. He has difficulty obtaining medications. Will provide 2 month prescription for carvedilol, lasix and change diovan to losartan 50 mg daily. He is not on Spironolactone due to hyperkalemia. Check BMET. If potassium/creatinine ok anticipate increasing losartan at next visit. Follow up in 1 month.

## 2011-12-01 NOTE — Patient Instructions (Addendum)
Stop Diovan when you run out then Start Losartan 50 mg daily  Follow in 1 month  Do the following things EVERYDAY: 1) Weigh yourself in the morning before breakfast. Write it down and keep it in a log. 2) Take your medicines as prescribed 3) Eat low salt foods-Limit salt (sodium) to 2000 mg per day.  4) Stay as active as you can everyday 5) Limit all fluids for the day to less than 2 liters

## 2011-12-31 ENCOUNTER — Encounter (HOSPITAL_COMMUNITY): Payer: Self-pay

## 2012-01-11 ENCOUNTER — Ambulatory Visit (HOSPITAL_COMMUNITY)
Admission: RE | Admit: 2012-01-11 | Discharge: 2012-01-11 | Disposition: A | Discharge status: Home / Self Care | Payer: Self-pay | Source: Ambulatory Visit | Attending: Internal Medicine | Admitting: Internal Medicine

## 2012-01-11 VITALS — BP 132/80 | HR 70 | Wt 203.2 lb

## 2012-01-11 DIAGNOSIS — I5022 Chronic systolic (congestive) heart failure: Secondary | ICD-10-CM | POA: Insufficient documentation

## 2012-01-11 NOTE — Progress Notes (Signed)
Patient ID: [name] Harrington, male   DOB: [birth year], 46 y.o.   MRN: [id number] HPI: [name] Harrington is a 46 year old male with h/o CHF due to non-ischemic CM EF 20-25% s/p ICD placement (2010). He also has h/o VT, chronic LBBB and ETOH abuse. Had angiodedema with ACE-I. Has been following with Dr. Johney Frame in EP.   01/23/2011 ECHO EF 20-25%  CPX test 3/13:  FEV1/FVC 60%  pVO2 = 23.4 (71%) - not corrected for weight Slope 26 RER 0.98 Ve/MVV = 74% (lung limited) O2 pulse 100%  10/22/11 ECHO EF 25 %  He returns for follow up. Last visit started on Losartan 50 mg daily. Denies SOB/PND/Orthopnea. Weight 203 pounds.  He has difficulty paying for medications. Compliant with medications. He has not taken his medications this am. He was followed by [facility] but now he does not have a PCP. Plans to try and get an appointment with [facility].  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 Stopped smoking over a year ago.   ROS: All systems negative except as listed in HPI, PMH and Problem List.  Past Medical History  Diagnosis Date  . Alcohol abuse   . Ventricular tachycardia     a. cath 10/22/2008- NL Cors. EF 25% b. s/p St.  Jude current + DR dual chamber AICD 10/22/2008  . CHF (congestive heart failure)     a. 09/2008 echo: EF 25%  . Palpitations     afib  . Pre-syncope   . Gout   . LBBB (left bundle branch block)     Current Outpatient Prescriptions  Medication Sig Dispense Refill  . allopurinol (ZYLOPRIM) 300 MG tablet Take 300 mg by mouth daily.        . carvedilol (COREG) 25 MG tablet Take 1 tablet (25 mg total) by mouth 2 (two) times daily with a meal.  60 tablet  2  . furosemide (LASIX) 40 MG tablet Take 1 tablet (40 mg total) by mouth as needed.  30 tablet  2  . hydrochlorothiazide (HYDRODIURIL) 12.5 MG tablet Take 1 tablet (12.5 mg total) by mouth daily.  30 tablet  6  . losartan (COZAAR) 50 MG tablet Take 1 tablet (50 mg total) by mouth daily.  30 tablet  2  . omeprazole (PRILOSEC) 20  MG capsule Take 20 mg by mouth daily.          PHYSICAL EXAM: Filed Vitals:   01/11/12 0924  BP: 132/80  Pulse: 70  Weight: 203 lb 4 oz (92.194 kg)  SpO2: 98%   General:  Well appearing. No resp difficulty HEENT: normal Neck: supple. JVP flat. Carotids 2+ bilaterally; no bruits. No lymphadenopathy or thryomegaly appreciated. Cor: PMI normal. Distant. Regular rate & rhythm. No rubs, gallops or murmurs. Lungs: clear with decreased BS throughout. No wheeze.  Abdomen: obese soft, nontender, nondistended. No hepatosplenomegaly. No bruits or masses. Good bowel sounds. Extremities: no cyanosis, clubbing, rash, edema Neuro: alert & orientedx3, cranial nerves grossly intact. Moves all 4 extremities w/o difficulty. Affect pleasant.    ASSESSMENT & PLAN:

## 2012-01-11 NOTE — Assessment & Plan Note (Signed)
He continues to improved. NYHA I. Volume status stable. Continue current medications. Will titrate up losartan today as he did not take his medications this am. Provided an additional month of HF medications.   Follow in 3 months.

## 2012-01-11 NOTE — Patient Instructions (Addendum)
Follow up in 3 months   Do the following things EVERYDAY: 1) Weigh yourself in the morning before breakfast. Write it down and keep it in a log. 2) Take your medicines as prescribed 3) Eat low salt foods-Limit salt (sodium) to 2000 mg per day.  4) Stay as active as you can everyday 5) Limit all fluids for the day to less than 2 liters  

## 2012-04-11 ENCOUNTER — Ambulatory Visit (HOSPITAL_COMMUNITY)
Admission: RE | Admit: 2012-04-11 | Discharge: 2012-04-11 | Disposition: A | Discharge status: Home / Self Care | Payer: Self-pay | Source: Ambulatory Visit | Attending: Internal Medicine | Admitting: Internal Medicine

## 2012-04-11 VITALS — BP 152/94 | HR 72 | Wt 206.2 lb

## 2012-04-11 DIAGNOSIS — I5022 Chronic systolic (congestive) heart failure: Secondary | ICD-10-CM | POA: Insufficient documentation

## 2012-04-11 MED ORDER — CARVEDILOL 25 MG PO TABS: 25.0000 mg | ORAL_TABLET | Freq: Two times a day (BID) | ORAL | Status: DC

## 2012-04-11 MED ORDER — LOSARTAN POTASSIUM 100 MG PO TABS: 100.0000 mg | ORAL_TABLET | Freq: Every day | ORAL | Status: DC

## 2012-04-11 MED ORDER — FUROSEMIDE 40 MG PO TABS: 40.0000 mg | ORAL_TABLET | ORAL | Status: DC | PRN

## 2012-04-11 NOTE — Assessment & Plan Note (Addendum)
NYHA I. Volume status stable. SBP > 130. Increase losartan to 100 mg daily. Check BMET next week at PCP visit and with order to fax results to HF clinic 04/21/12. Provided HF medications via HF clinic medication assistance program.  Follow up in 3 months.

## 2012-04-11 NOTE — Patient Instructions (Addendum)
Follow up in 3 months  Take Losartan 100 mg daily  Do the following things EVERYDAY: 1) Weigh yourself in the morning before breakfast. Write it down and keep it in a log. 2) Take your medicines as prescribed 3) Eat low salt foods-Limit salt (sodium) to 2000 mg per day.  4) Stay as active as you can everyday 5) Limit all fluids for the day to less than 2 liters

## 2012-04-11 NOTE — Progress Notes (Signed)
Patient ID: [name] Harrington, male   DOB: 1965/11/24, 46 y.o.   MRN: [id number]  HPI: Rishik is a 46 year old male with h/o CHF due to non-ischemic CM EF 20-25% s/p ICD placement (2010). He also has h/o VT, chronic LBBB and ETOH abuse. Had angiodedema with ACE-I. Has been following with Dr. Johney Frame in [place].   01/23/2011 ECHO EF 20-25%  CPX test 3/13:  FEV1/FVC 60%  pVO2 = 23.4 (71%) - not corrected for weight Slope 26 RER 0.98 Ve/MVV = 74% (lung limited) O2 pulse 100%  10/22/11 ECHO EF 25 %  He returns for follow up. Denies SOB/PND/Orthopnea/CP. Plans to try and get an appointment with [facility] 04/21/12 to establish as primary care.   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 Weight at home 205. He has had a couple doses of lasix for bloating. He has difficulty paying for medications.    ROS: All systems negative except as listed in HPI, PMH and Problem List.  Past Medical History  Diagnosis Date  . Alcohol abuse   . Ventricular tachycardia     a. cath 10/22/2008- NL Cors. EF 25% b. s/p St.  Jude current + DR dual chamber AICD 10/22/2008  . CHF (congestive heart failure)     a. 09/2008 echo: EF 25%  . Palpitations     afib  . Pre-syncope   . Gout   . LBBB (left bundle branch block)     Current Outpatient Prescriptions  Medication Sig Dispense Refill  . allopurinol (ZYLOPRIM) 300 MG tablet Take 300 mg by mouth daily.        . carvedilol (COREG) 25 MG tablet Take 1 tablet (25 mg total) by mouth 2 (two) times daily with a meal.  60 tablet  2  . furosemide (LASIX) 40 MG tablet Take 1 tablet (40 mg total) by mouth as needed.  30 tablet  2  . hydrochlorothiazide (HYDRODIURIL) 12.5 MG tablet Take 1 tablet (12.5 mg total) by mouth daily.  30 tablet  6  . losartan (COZAAR) 100 MG tablet Take 1 tablet (100 mg total) by mouth daily.  30 tablet  2  . omeprazole (PRILOSEC) 20 MG capsule Take 20 mg by mouth daily.          PHYSICAL EXAM: Filed Vitals:   04/11/12 1503  BP: 152/94   Pulse: 72  Weight: 206 lb 4 oz (93.554 kg)  SpO2: 99%   General:  Well appearing. No resp difficulty HEENT: normal Neck: supple. JVP flat. Carotids 2+ bilaterally; no bruits. No lymphadenopathy or thryomegaly appreciated. Cor: PMI normal. Distant. Regular rate & rhythm. No rubs, gallops or murmurs. Lungs: clear with decreased BS throughout. No wheeze.  Abdomen: obese soft, nontender, nondistended. No hepatosplenomegaly. No bruits or masses. Good bowel sounds. Extremities: no cyanosis, clubbing, rash, edema Neuro: alert & orientedx3, cranial nerves grossly intact. Moves all 4 extremities w/o difficulty. Affect pleasant.    ASSESSMENT & PLAN:

## 2012-04-26 ENCOUNTER — Encounter: Payer: Self-pay | Admitting: *Deleted

## 2012-06-17 ENCOUNTER — Encounter: Payer: Self-pay | Admitting: Internal Medicine

## 2012-07-08 ENCOUNTER — Encounter: Payer: Self-pay | Admitting: Internal Medicine

## 2012-07-11 ENCOUNTER — Ambulatory Visit (HOSPITAL_COMMUNITY)
Admission: RE | Admit: 2012-07-11 | Discharge: 2012-07-11 | Disposition: A | Discharge status: Home / Self Care | Payer: Self-pay | Source: Ambulatory Visit | Attending: Internal Medicine | Admitting: Internal Medicine

## 2012-07-11 ENCOUNTER — Encounter (HOSPITAL_COMMUNITY): Payer: Self-pay

## 2012-07-11 VITALS — BP 132/88 | HR 79 | Wt 209.8 lb

## 2012-07-11 DIAGNOSIS — I5022 Chronic systolic (congestive) heart failure: Secondary | ICD-10-CM | POA: Insufficient documentation

## 2012-07-11 DIAGNOSIS — F1010 Alcohol abuse, uncomplicated: Secondary | ICD-10-CM | POA: Insufficient documentation

## 2012-07-11 NOTE — Progress Notes (Signed)
Patient ID: [name] Harrington, male   DOB: [birth year], 47 y.o.   MRN: [id number] PCP: Dr Wayland Denis- Jov[facility] Kussmaul EP: Dr Johney Frame  HPI: [name] Harrington is a 47 year old male with h/o CHF due to non-ischemic CM EF 20-25% s/p ICD placement (ST Jude 2010). He also has h/o VT, chronic LBBB and ETOH abuse. Has been following with Dr. Johney Frame in EP. 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    01/23/2011 ECHO EF 20-25%  CPX test 3/13:  FEV1/FVC 60%  pVO2 = 23.4 (71%) - not corrected for weight Slope 26 RER 0.98 Ve/MVV = 74% (lung limited) O2 pulse 100%  10/22/11 ECHO EF 25 %  He returns for follow up. Last visit losartan increased to 100 mg daily. Denies SOB/PND/Orthopnea/CP. Able to walk up steps.   No ICD shocks. Weight at home 207-210. He has not required any extra Lasix. Alcohol 1 or 2 beers on the weekends. BP at PCP appointment 134/90. Scheduled to have labs drawn at [facility] 07/29/12.    ROS: All systems negative except as listed in HPI, PMH and Problem List.  Past Medical History  Diagnosis Date  . Alcohol abuse   . Ventricular tachycardia     a. cath 10/22/2008- NL Cors. EF 25% b. s/p St.  Jude current + DR dual chamber AICD 10/22/2008  . CHF (congestive heart failure)     a. 09/2008 echo: EF 25%  . Palpitations     afib  . Pre-syncope   . Gout   . LBBB (left bundle branch block)     Current Outpatient Prescriptions  Medication Sig Dispense Refill  . allopurinol (ZYLOPRIM) 300 MG tablet Take 300 mg by mouth daily.        . carvedilol (COREG) 25 MG tablet Take 1 tablet (25 mg total) by mouth 2 (two) times daily with a meal.  60 tablet  2  . furosemide (LASIX) 40 MG tablet Take 1 tablet (40 mg total) by mouth as needed.  30 tablet  2  . hydrochlorothiazide (HYDRODIURIL) 12.5 MG tablet Take 1 tablet (12.5 mg total) by mouth daily.  30 tablet  6  . losartan (COZAAR) 100 MG tablet Take 1 tablet (100 mg total) by mouth daily.  30 tablet  2  . omeprazole (PRILOSEC) 20 MG capsule Take 20 mg by mouth  daily.         No current facility-administered medications for this encounter.    PHYSICAL EXAM: Filed Vitals:   07/11/12 1439  BP: 132/88  Pulse: 79  Weight: 209 lb 12.8 oz (95.165 kg)  SpO2: 97%   General:  Well appearing. No resp difficulty HEENT: normal Neck: supple. JVP flat. Carotids 2+ bilaterally; no bruits. No lymphadenopathy or thryomegaly appreciated. Cor: PMI normal. Distant. Regular rate & rhythm. No rubs, gallops or murmurs. Lungs: clear with decreased BS throughout. No wheeze.  Abdomen: obese soft, nontender, nondistended. No hepatosplenomegaly. No bruits or masses. Good bowel sounds. Extremities: no cyanosis, clubbing, rash, edema Neuro: alert & orientedx3, cranial nerves grossly intact. Moves all 4 extremities w/o difficulty. Affect pleasant.    ASSESSMENT & PLAN:

## 2012-07-11 NOTE — Assessment & Plan Note (Addendum)
NYHA I. Volume status stable. Continue current regimen. Reinforced medication compliance, daily weights, and low salt food choices. BMET to be obtained 07/29/12 at [facility] and faxed to [facility] clininc. Follow up in 3 months with an ECHO.

## 2012-07-11 NOTE — Patient Instructions (Addendum)
Follow up in 3 months with an ECHO  Do the following things EVERYDAY: 1) Weigh yourself in the morning before breakfast. Write it down and keep it in a log. 2) Take your medicines as prescribed 3) Eat low salt foods-Limit salt (sodium) to 2000 mg per day.  4) Stay as active as you can everyday 5) Limit all fluids for the day to less than 2 liters 

## 2012-07-11 NOTE — Assessment & Plan Note (Signed)
He is drinking 2-4 beers a week. I have reinforced alcohol cessation due negative effects on his heart.

## 2012-07-20 ENCOUNTER — Encounter: Payer: Self-pay | Admitting: Internal Medicine

## 2012-07-27 ENCOUNTER — Encounter: Payer: Self-pay | Admitting: Internal Medicine

## 2012-08-02 ENCOUNTER — Encounter (HOSPITAL_COMMUNITY): Payer: Self-pay | Admitting: Emergency Medicine

## 2012-08-02 ENCOUNTER — Emergency Department (HOSPITAL_COMMUNITY)
Admission: EM | Admit: 2012-08-02 | Discharge: 2012-08-02 | Disposition: A | Discharge status: Home / Self Care | Payer: Self-pay | Attending: Emergency Medicine | Admitting: Emergency Medicine

## 2012-08-02 DIAGNOSIS — Z23 Encounter for immunization: Secondary | ICD-10-CM | POA: Insufficient documentation

## 2012-08-02 DIAGNOSIS — W268XXA Contact with other sharp object(s), not elsewhere classified, initial encounter: Secondary | ICD-10-CM | POA: Insufficient documentation

## 2012-08-02 DIAGNOSIS — Z79899 Other long term (current) drug therapy: Secondary | ICD-10-CM | POA: Insufficient documentation

## 2012-08-02 DIAGNOSIS — Z8679 Personal history of other diseases of the circulatory system: Secondary | ICD-10-CM | POA: Insufficient documentation

## 2012-08-02 DIAGNOSIS — S0181XA Laceration without foreign body of other part of head, initial encounter: Secondary | ICD-10-CM

## 2012-08-02 DIAGNOSIS — W01119A Fall on same level from slipping, tripping and stumbling with subsequent striking against unspecified sharp object, initial encounter: Secondary | ICD-10-CM | POA: Insufficient documentation

## 2012-08-02 DIAGNOSIS — F1010 Alcohol abuse, uncomplicated: Secondary | ICD-10-CM | POA: Insufficient documentation

## 2012-08-02 DIAGNOSIS — Y9389 Activity, other specified: Secondary | ICD-10-CM | POA: Insufficient documentation

## 2012-08-02 DIAGNOSIS — I509 Heart failure, unspecified: Secondary | ICD-10-CM | POA: Insufficient documentation

## 2012-08-02 DIAGNOSIS — M109 Gout, unspecified: Secondary | ICD-10-CM | POA: Insufficient documentation

## 2012-08-02 DIAGNOSIS — F10929 Alcohol use, unspecified with intoxication, unspecified: Secondary | ICD-10-CM

## 2012-08-02 DIAGNOSIS — Y929 Unspecified place or not applicable: Secondary | ICD-10-CM | POA: Insufficient documentation

## 2012-08-02 DIAGNOSIS — Z950 Presence of cardiac pacemaker: Secondary | ICD-10-CM | POA: Insufficient documentation

## 2012-08-02 DIAGNOSIS — S0190XA Unspecified open wound of unspecified part of head, initial encounter: Secondary | ICD-10-CM | POA: Insufficient documentation

## 2012-08-02 MED ORDER — TETANUS-DIPHTH-ACELL PERTUSSIS 5-2.5-18.5 LF-MCG/0.5 IM SUSP
0.5000 mL | Freq: Once | INTRAMUSCULAR | Status: AC
  Administered 2012-08-02: 0.5 mL via INTRAMUSCULAR
  Filled 2012-08-02: qty 0.5

## 2012-08-02 NOTE — ED Notes (Signed)
LSB removed with assistance of rn and emt , pt. Reports chronic low back pain . No step off noted. . Repositioned for comfort. Respirations unlabored . C- collar intact.
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
## 2012-08-07 ENCOUNTER — Emergency Department (INDEPENDENT_AMBULATORY_CARE_PROVIDER_SITE_OTHER)
Admission: EM | Admit: 2012-08-07 | Discharge: 2012-08-07 | Disposition: A | Discharge status: Home / Self Care | Payer: Self-pay | Source: Home / Self Care | Attending: Family Medicine | Admitting: Family Medicine

## 2012-08-07 ENCOUNTER — Encounter (HOSPITAL_COMMUNITY): Payer: Self-pay | Admitting: *Deleted

## 2012-08-07 DIAGNOSIS — Z4802 Encounter for removal of sutures: Secondary | ICD-10-CM

## 2012-08-07 MED ORDER — MEDERMA EX GEL: 1.0000 "application " | Freq: Two times a day (BID) | CUTANEOUS | Status: DC | PRN

## 2012-08-07 MED ORDER — MUPIROCIN 2 % EX OINT: TOPICAL_OINTMENT | Freq: Three times a day (TID) | CUTANEOUS | Status: DC

## 2012-08-07 NOTE — ED Notes (Signed)
Pt needs suture removal from facial lacerations - no s/s of infections , wounds well healed
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
## 2012-12-09 ENCOUNTER — Encounter (HOSPITAL_COMMUNITY): Payer: Self-pay | Admitting: Cardiology

## 2012-12-09 ENCOUNTER — Telehealth (HOSPITAL_COMMUNITY): Payer: Self-pay | Admitting: Cardiology

## 2012-12-09 NOTE — Telephone Encounter (Signed)
I have been unable to reach this patient by phone.  A letter is being sent to the last known home address. Needs 3 month f/u with echo

## 2013-04-11 ENCOUNTER — Telehealth (HOSPITAL_COMMUNITY): Payer: Self-pay | Admitting: *Deleted

## 2013-04-11 NOTE — Telephone Encounter (Signed)
Received call from [place] at eye center pt is sch to have cataract removed tomorrow and needs clearance, per Dr Gala Romney pt ok to proceed Raynelle Fanning aware and note faxed to her at [phone]

## 2013-07-31 ENCOUNTER — Encounter: Payer: Self-pay | Admitting: *Deleted

## 2013-08-15 ENCOUNTER — Ambulatory Visit: Payer: Self-pay | Admitting: Internal Medicine

## 2013-08-17 ENCOUNTER — Encounter: Payer: Self-pay | Admitting: Internal Medicine

## 2013-08-17 ENCOUNTER — Ambulatory Visit: Payer: Self-pay | Attending: Internal Medicine | Admitting: Internal Medicine

## 2013-08-17 VITALS — BP 145/98 | HR 75 | Temp 98.7°F | Resp 18 | Ht 71.0 in | Wt 212.0 lb

## 2013-08-17 DIAGNOSIS — M109 Gout, unspecified: Secondary | ICD-10-CM

## 2013-08-17 DIAGNOSIS — I10 Essential (primary) hypertension: Secondary | ICD-10-CM

## 2013-08-17 DIAGNOSIS — I4891 Unspecified atrial fibrillation: Secondary | ICD-10-CM | POA: Insufficient documentation

## 2013-08-17 DIAGNOSIS — I509 Heart failure, unspecified: Secondary | ICD-10-CM | POA: Insufficient documentation

## 2013-08-17 DIAGNOSIS — Z7689 Persons encountering health services in other specified circumstances: Secondary | ICD-10-CM

## 2013-08-17 DIAGNOSIS — Z7189 Other specified counseling: Secondary | ICD-10-CM

## 2013-08-17 MED ORDER — CARVEDILOL 25 MG PO TABS: 25.0000 mg | ORAL_TABLET | Freq: Two times a day (BID) | ORAL | Status: DC

## 2013-08-17 MED ORDER — ALLOPURINOL 300 MG PO TABS: 300.0000 mg | ORAL_TABLET | Freq: Every day | ORAL | Status: DC

## 2013-08-17 NOTE — Progress Notes (Signed)
Patient ID: [name] CleaverMarcus Harrington, male   DOB: 1965-07-22, 48 y.o.   MRN: [id number]   [name] Harrington, is a 48 y.o. male  [id number]  [id number]  DOB - 1965-07-22  CC:  Chief Complaint  Patient presents with  . Establish Care  . Hypertension  . Gout       HPI: [name] Harrington is a 48 y.o. male here today to establish medical care and for medical management of his hypertension and gout.  Patient states that he has been getting refills of his medication from the ER and now he needs a PCP.  Patient reports that he is being followed by cardiology for his CHF and is due for a Echo soon.  He reports that he has been on carvedilol for years without any complications.  He reports concern that he will have a gout exacerbation if he runs out of his allopurinol.  The patient is up to date on all of his preventative care needs.  The patient refuses to have his prostated checked today due to the uncomfortable feeling he has even after counseling.  Patient has No headache, No chest pain, No abdominal pain - No Nausea, No new weakness tingling or numbness, No Cough - SOB.  Allergies  Allergen Reactions  . Ace Inhibitors     REACTION: angioedema   Past Medical History  Diagnosis Date  . Alcohol abuse   . Ventricular tachycardia     a. cath 10/22/2008- NL Cors. EF 25% b. s/p St.  Jude current + DR dual chamber AICD 10/22/2008  . CHF (congestive heart failure)     a. 09/2008 echo: EF 25%  . Palpitations     afib  . Pre-syncope   . Gout   . LBBB (left bundle branch block)    Current Outpatient Prescriptions on File Prior to Visit  Medication Sig Dispense Refill  . furosemide (LASIX) 40 MG tablet Take 40 mg by mouth as needed.      Marland Kitchen. omeprazole (PRILOSEC) 20 MG capsule Take 20 mg by mouth daily.        Marland Kitchen. losartan (COZAAR) 100 MG tablet Take 100 mg by mouth daily.      . mupirocin ointment (BACTROBAN) 2 % Apply topically 3 (three) times daily.  22 g  0  . Scar Treatment Products (MEDERMA) GEL Apply  1 application topically 2 (two) times daily as needed.  1 Tube  0   No current facility-administered medications on file prior to visit.   Family History  Problem Relation Age of Onset  . Breast cancer Mother     hx  . Graves' disease Sister    History   Social History  . Marital Status: Single    Spouse Name: N/A    Number of Children: N/A  . Years of Education: N/A   Occupational History  . Not on file.   Social History Main Topics  . Smoking status: Never Smoker   . Smokeless tobacco: Not on file  . Alcohol Use: Yes     Comment: frequently  . Drug Use: No  . Sexual Activity: Yes   Other Topics Concern  . Not on file   Social History Narrative   Unemployed [place]. Drinks alcohol frequently     Review of Systems: Constitutional: Negative for fever, chills, diaphoresis, activity change, appetite change and fatigue. HENT: Negative for ear pain, nosebleeds, congestion, facial swelling, rhinorrhea, neck pain, neck stiffness and ear discharge.  Eyes: Negative for pain,  discharge, redness, itching and visual disturbance. Respiratory: Negative for cough, choking, chest tightness, shortness of breath, wheezing and stridor.  Cardiovascular: Negative for chest pain, palpitations and leg swelling. Gastrointestinal: Negative for abdominal distention. Genitourinary: Negative for dysuria, urgency, frequency, hematuria, flank pain, decreased urine volume, difficulty urinating and dyspareunia.  Musculoskeletal: Negative for back pain, joint swelling, arthralgia and gait problem. Neurological: Negative for dizziness, tremors, seizures, syncope, facial asymmetry, speech difficulty, weakness, light-headedness, numbness and headaches.  Hematological: Negative for adenopathy. Does not bruise/bleed easily. Psychiatric/Behavioral: Negative for hallucinations, behavioral problems, confusion, dysphoric mood, decreased concentration and agitation.    Objective:    Filed Vitals:   08/17/13 1422  BP: 145/98  Pulse: 75  Temp: 98.7 F (37.1 C)  Resp: 18    Physical Exam: Constitutional: Patient appears well-developed and well-nourished. No distress. HENT: Normocephalic, atraumatic, External right and left ear normal. Oropharynx is clear and moist.  Eyes: Conjunctivae and EOM are normal. PERRLA, no scleral icterus. Neck: Normal ROM. Neck supple. No JVD. No tracheal deviation. No thyromegaly. CVS: RRR, S1/S2 +, no murmurs, no gallops, no carotid bruit.  Pulmonary: Effort and breath sounds normal, no stridor, rhonchi, wheezes, rales.  Abdominal: Soft. BS +, no distension, tenderness, rebound or guarding.  Musculoskeletal: Normal range of motion. No edema and no tenderness.  Lymphadenopathy: No lymphadenopathy noted, cervical Neuro: Alert. Normal reflexes, muscle tone coordination. No cranial nerve deficit. Skin: Skin is warm and dry. No rash noted. Not diaphoretic. No erythema. No pallor. Psychiatric: Normal mood and affect. Behavior, judgment, thought content normal.  Lab Results  Component Value Date   WBC 10.3 10/25/2008   HGB 14.1 10/25/2008   HCT 43.3 10/25/2008   MCV 88.0 10/25/2008   PLT 229 10/25/2008   Lab Results  Component Value Date   CREATININE 1.05 12/01/2011   BUN 22 12/01/2011   NA 136 12/01/2011   K 4.4 12/01/2011   CL 100 12/01/2011   CO2 28 12/01/2011    No results found for this basename: HGBA1C   Lipid Panel     Component Value Date/Time   CHOL 204* 01/23/2011 0937   TRIG 241.0* 01/23/2011 0937   HDL 59.40 01/23/2011 0937   CHOLHDL 3 01/23/2011 0937   VLDL 48.2* 01/23/2011 0937   LDLCALC 75 06/05/2010 2039       Assessment and plan:   [name] was seen today for establish care, hypertension and gout.  Diagnoses and associated orders for this visit:  HTN (hypertension) Continue with medication regimen - carvedilol (COREG) 25 MG tablet; Take 1 tablet (25 mg total) by mouth 2 (two) times daily with a meal.  Gout - allopurinol  (ZYLOPRIM) 300 MG tablet; Take 1 tablet (300 mg total) by mouth daily.   Return in about 3 months (around 11/16/2013) for Hypertension.  Patient needs to make appointment soon for lab work.     Holland CommonsValerie Chandy Tarman, NP-C [facility] [phone] 08/17/2013, 2:40 PM

## 2013-08-17 NOTE — Patient Instructions (Signed)
Make follow up appointment with Cardiology for Echo   DASH Diet The DASH diet stands for "Dietary Approaches to Stop Hypertension." It is a healthy eating plan that has been shown to reduce high blood pressure (hypertension) in as little as 14 days, while also possibly providing other significant health benefits. These other health benefits include reducing the risk of breast cancer after menopause and reducing the risk of type 2 diabetes, heart disease, colon cancer, and stroke. Health benefits also include weight loss and slowing kidney failure in patients with chronic kidney disease.  DIET GUIDELINES  Limit salt (sodium). Your diet should contain less than 1500 mg of sodium daily.  Limit refined or processed carbohydrates. Your diet should include mostly whole grains. Desserts and added sugars should be used sparingly.  Include small amounts of heart-healthy fats. These types of fats include nuts, oils, and tub margarine. Limit saturated and trans fats. These fats have been shown to be harmful in the body. CHOOSING FOODS  The following food groups are based on a 2000 calorie diet. See your Registered Dietitian for individual calorie needs. Grains and Grain Products (6 to 8 servings daily)  Eat More Often: Whole-wheat bread, brown rice, whole-grain or wheat pasta, quinoa, popcorn without added fat or salt (air popped).  Eat Less Often: White bread, white pasta, white rice, cornbread. Vegetables (4 to 5 servings daily)  Eat More Often: Fresh, frozen, and canned vegetables. Vegetables may be raw, steamed, roasted, or grilled with a minimal amount of fat.  Eat Less Often/Avoid: Creamed or fried vegetables. Vegetables in a cheese sauce. Fruit (4 to 5 servings daily)  Eat More Often: All fresh, canned (in natural juice), or frozen fruits. Dried fruits without added sugar. One hundred percent fruit juice ( cup [237 mL] daily).  Eat Less Often: Dried fruits with added sugar. Canned fruit in  light or heavy syrup. Foot LockerLean Meats, Fish, and Poultry (2 servings or less daily. One serving is 3 to 4 oz [85-114 g]).  Eat More Often: Ninety percent or leaner ground beef, tenderloin, sirloin. Round cuts of beef, chicken breast, Malaw[place]urkey breast. All fish. Grill, bake, or broil your meat. Nothing should be fried.  Eat Less Often/Avoid: Fatty cuts of meat, Malaw[place]urkey, or chicken leg, thigh, or wing. Fried cuts of meat or fish. Dairy (2 to 3 servings)  Eat More Often: Low-fat or fat-free milk, low-fat plain or light yogurt, reduced-fat or part-skim cheese.  Eat Less Often/Avoid: Milk (whole, 2%).Whole milk yogurt. Full-fat cheeses. Nuts, Seeds, and Legumes (4 to 5 servings per week)  Eat More Often: All without added salt.  Eat Less Often/Avoid: Salted nuts and seeds, canned beans with added salt. Fats and Sweets (limited)  Eat More Often: Vegetable oils, tub margarines without trans fats, sugar-free gelatin. Mayonnaise and salad dressings.  Eat Less Often/Avoid: Coconut oils, palm oils, butter, stick margarine, cream, half and half, cookies, candy, pie. FOR MORE INFORMATION The Dash Diet Eating Plan: www.dashdiet.org Document Released: 04/02/2011 Document Revised: 07/06/2011 Document Reviewed: 04/02/2011 [facility] Patient Information 2014 [place].

## 2013-08-17 NOTE — Progress Notes (Signed)
Pt here to establish care for Hx HTN, Gout States he ran out of Allopurinol but feels an attack coming on  C/o right knee achy,sore pain 3/10 Pt need appt with Dr. Daleen SquibbWall for Atrial fib,Chronic CHF BP- 145/98 75

## 2013-08-18 ENCOUNTER — Encounter: Payer: Self-pay | Admitting: Internal Medicine

## 2013-08-18 ENCOUNTER — Telehealth: Payer: Self-pay | Admitting: Internal Medicine

## 2013-08-18 NOTE — Telephone Encounter (Signed)
08-18-13 SENT PAST DUE LETTER, NEEDS DEFIB CK WITH ALLRED/BROOKE /MT

## 2013-11-06 ENCOUNTER — Other Ambulatory Visit: Payer: Self-pay

## 2013-11-16 ENCOUNTER — Ambulatory Visit: Payer: Self-pay | Admitting: Internal Medicine

## 2014-02-27 ENCOUNTER — Encounter: Payer: Self-pay | Admitting: *Deleted

## 2014-04-10 ENCOUNTER — Encounter: Payer: Self-pay | Admitting: *Deleted

## 2014-06-04 ENCOUNTER — Encounter: Payer: Self-pay | Admitting: Internal Medicine

## 2014-07-30 ENCOUNTER — Encounter: Payer: Self-pay | Admitting: Internal Medicine

## 2014-07-30 ENCOUNTER — Ambulatory Visit (INDEPENDENT_AMBULATORY_CARE_PROVIDER_SITE_OTHER): Payer: Self-pay | Admitting: Internal Medicine

## 2014-07-30 VITALS — BP 152/110 | HR 88 | Ht 70.0 in | Wt 215.2 lb

## 2014-07-30 DIAGNOSIS — I10 Essential (primary) hypertension: Secondary | ICD-10-CM

## 2014-07-30 DIAGNOSIS — I5022 Chronic systolic (congestive) heart failure: Secondary | ICD-10-CM

## 2014-07-30 DIAGNOSIS — I4729 Other ventricular tachycardia: Secondary | ICD-10-CM

## 2014-07-30 DIAGNOSIS — F1010 Alcohol abuse, uncomplicated: Secondary | ICD-10-CM

## 2014-07-30 DIAGNOSIS — I472 Ventricular tachycardia: Secondary | ICD-10-CM

## 2014-07-30 LAB — MDC_IDC_ENUM_SESS_TYPE_INCLINIC
Date Time Interrogation Session: 20160404101231
HighPow Impedance: 51.4773
Lead Channel Impedance Value: 312.5 Ohm
Lead Channel Pacing Threshold Amplitude: 0.75 V
Lead Channel Pacing Threshold Amplitude: 0.75 V
Lead Channel Pacing Threshold Amplitude: 1 V
Lead Channel Pacing Threshold Pulse Width: 0.5 ms
Lead Channel Pacing Threshold Pulse Width: 0.5 ms
Lead Channel Sensing Intrinsic Amplitude: 12 mV
Lead Channel Sensing Intrinsic Amplitude: 2 mV
Lead Channel Setting Pacing Amplitude: 2 V
Lead Channel Setting Pacing Pulse Width: 0.5 ms
Lead Channel Setting Sensing Sensitivity: 0.3 mV
MDC IDC MSMT BATTERY REMAINING LONGEVITY: 50.4 mo
MDC IDC MSMT BATTERY VOLTAGE: 2.57 V
MDC IDC MSMT LEADCHNL RA PACING THRESHOLD PULSEWIDTH: 0.5 ms
MDC IDC MSMT LEADCHNL RV IMPEDANCE VALUE: 362.5 Ohm
MDC IDC MSMT LEADCHNL RV PACING THRESHOLD AMPLITUDE: 1 V
MDC IDC MSMT LEADCHNL RV PACING THRESHOLD PULSEWIDTH: 0.5 ms
MDC IDC PG SERIAL: 475704
MDC IDC SET LEADCHNL RV PACING AMPLITUDE: 2.5 V
MDC IDC SET ZONE DETECTION INTERVAL: 270 ms
MDC IDC STAT BRADY RA PERCENT PACED: 5.8 %
MDC IDC STAT BRADY RV PERCENT PACED: 0.8 %
Zone Setting Detection Interval: 345 ms

## 2014-07-30 NOTE — Progress Notes (Signed)
Electrophysiology Office Note   Date:  07/30/2014   ID:  [name] Harrington, DOB [birth year], MRN [id number]  PCP:  Holland Commons, NP  Cardiologist:  Dr Gala Romney Primary Electrophysiologist: Hillis Range, MD    No chief complaint on file.    History of Present Illness: [name] Harrington is a 49 y.o. male who presents today for electrophysiology evaluation.   He has not been seen by me in several years.  He has also not been to the CHF clinic since 2014.  He seems to be doing reasonably well.  He has been off of amiodarone for several years without any ventricular arrhythmias.  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  He finds that he is tired during July/ Aug heat but otherwise is without significant limitation.  Today, he denies symptoms of palpitations, chest pain, shortness of breath, orthopnea, PND, lower extremity edema, claudication, dizziness, presyncope, syncope, bleeding, or neurologic sequela. The patient is tolerating medications without difficulties and is otherwise without complaint today.    Past Medical History  Diagnosis Date  . Alcohol abuse   . Ventricular tachycardia     a. cath 10/22/2008- NL Cors. EF 25% b. s/p St.  Jude current + DR dual chamber AICD 10/22/2008  . CHF (congestive heart failure)     a. 09/2008 echo: EF 25%  . Nonischemic cardiomyopathy     afib  . Pre-syncope   . Gout   . LBBB (left bundle branch block)    Past Surgical History  Procedure Laterality Date  . Closed reduction nasal fracture  11/13/10    w internal and external stabilization  . Fragment lag screw fixation    . Pacemaker placement    . Implantable cardioverter defibrillator implant       Current Outpatient Prescriptions  Medication Sig Dispense Refill  . allopurinol (ZYLOPRIM) 300 MG tablet Take 1 tablet (300 mg total) by mouth daily. 30 tablet 2  . carvedilol (COREG) 25 MG tablet Take 1 tablet (25 mg total) by mouth 2 (two) times daily with a meal. 60 tablet 2   . furosemide (LASIX) 40 MG tablet Take 40 mg by mouth daily as needed (swelling).     Marland Kitchen omeprazole (PRILOSEC) 20 MG capsule Take 20 mg by mouth daily.       No current facility-administered medications for this visit.    Allergies:   Ace inhibitors   Social History:  The patient  reports that he has never smoked. He does not have any smokeless tobacco history on file. He reports that he drinks alcohol. He reports that he does not use illicit drugs.   Family History:  The patient's family history includes Breast cancer in his mother; Luiz Blare' disease in his sister.    ROS:  Please see the history of present illness.   All other systems are reviewed and negative.    PHYSICAL EXAM: VS:  BP 152/110 mmHg  Pulse 88  Ht  (1.778 m)  Wt 215 lb 3.2 oz (97.614 kg)  BMI 30.88 kg/m2  SpO2 97% , BMI Body mass index is 30.88 kg/(m^2). GEN: Well nourished, well developed, in no acute distress HEENT: normal Neck: no JVD, carotid bruits, or masses Cardiac: RRR; no murmurs, rubs, or gallops,no edema  Respiratory:  clear to auscultation bilaterally, normal work of breathing GI: soft, nontender, nondistended, + BS MS: no deformity or atrophy Skin: warm and dry, device pocket is well healed Neuro:  Strength and sensation are  intact Psych: euthymic mood, full affect  Device interrogation is reviewed today in detail.  See PaceArt for details.   Recent Labs: No results found for requested labs within last 365 days.    Lipid Panel     Component Value Date/Time   CHOL 204* 01/23/2011 0937   TRIG 241.0* 01/23/2011 0937   HDL 59.40 01/23/2011 0937   CHOLHDL 3 01/23/2011 0937   VLDL 48.2* 01/23/2011 0937   LDLCALC 75 06/05/2010 2039   LDLDIRECT 63.0 01/23/2011 0937     Wt Readings from Last 3 Encounters:  07/30/14 215 lb 3.2 oz (97.614 kg)  08/17/13 212 lb (96.163 kg)  04/11/12 206 lb 4 oz (93.554 kg)     ASSESSMENT AND PLAN:  1.  Chronic systolic dysfunction/ nonischemic CM/  LBBB Doing well NYHA Class I Not on losartan presently Previously had angioedema with ace inhibitor BP is elevated Would refer back to CHF clinic for additional therapy Normal ICD function See Pace Art report No changes today Merlin transmitter and cell adapter are given to him today.  The importance of compliance is discussed today.   Would avoid upgrade to CRT-D at this time as he is doing very well and needs to demonstrate compliance with medical therapy, office visits, and ETOh cessation  2. HTN Above goal Consider restarting losartan upon follow-up with CHF clinic 2 gram sodium diet advised today  3. ETOH Cessation advised  4. VT Well controlled off of AAD therapy  5. afib No afib detected though he does have noise on his atrial lead Will continue to monitor remotely   Current medicines are reviewed at length with the patient today.   The patient does not have concerns regarding his medicines.  The following changes were made today:  none  Labs/ tests ordered today include:  Orders Placed This Encounter  Procedures  . Implantable device check    Follow-up: merlin, return to see me in 1 year, follow-up with CHF clinic is encouraged  Signed, Hillis RangeJames Lailyn Appelbaum, MD  07/30/2014 10:15 AM     [facility] [address] [place] [address] [phone] (office) [phone] (fax)

## 2014-07-30 NOTE — Patient Instructions (Addendum)
Remote monitoring is used to monitor your ICD from home. This monitoring reduces the number of office visits required to check your device to one time per year. It allows us to keep an eye on the functioning of your device to ensure it is working properly. You are scheduled for a device check from home on 10-30-2014. You may send your transmission at any time that day. If you have a wireless device, the transmission will be sent automatically. After your physician reviews your transmission, you will receive a postcard with your next transmission date.  Your physician recommends that you schedule a follow-up appointment in: 12 months with Dr.Allred  Your physician recommends that you schedule a follow-up appointment next available with Dr Gala RomneyBensimhon

## 2014-10-30 ENCOUNTER — Encounter: Payer: Self-pay | Admitting: *Deleted

## 2014-10-30 ENCOUNTER — Telehealth: Payer: Self-pay | Admitting: Cardiology

## 2014-10-30 NOTE — Telephone Encounter (Signed)
Spoke with pt and reminded pt of remote transmission that is due today. Pt verbalized understanding.   

## 2014-10-31 ENCOUNTER — Encounter: Payer: Self-pay | Admitting: Cardiology

## 2015-10-21 ENCOUNTER — Encounter: Payer: Self-pay | Admitting: Internal Medicine

## 2015-10-23 ENCOUNTER — Encounter: Payer: Self-pay | Admitting: Internal Medicine

## 2016-02-10 ENCOUNTER — Encounter: Payer: Self-pay | Admitting: Internal Medicine

## 2016-02-10 ENCOUNTER — Ambulatory Visit (INDEPENDENT_AMBULATORY_CARE_PROVIDER_SITE_OTHER): Payer: BLUE CROSS/BLUE SHIELD | Admitting: Internal Medicine

## 2016-02-10 VITALS — BP 182/110 | HR 91 | Resp 98 | Ht 71.0 in | Wt 216.0 lb

## 2016-02-10 DIAGNOSIS — I472 Ventricular tachycardia: Secondary | ICD-10-CM | POA: Diagnosis not present

## 2016-02-10 DIAGNOSIS — F1010 Alcohol abuse, uncomplicated: Secondary | ICD-10-CM

## 2016-02-10 DIAGNOSIS — I4729 Other ventricular tachycardia: Secondary | ICD-10-CM

## 2016-02-10 DIAGNOSIS — I10 Essential (primary) hypertension: Secondary | ICD-10-CM

## 2016-02-10 DIAGNOSIS — I5022 Chronic systolic (congestive) heart failure: Secondary | ICD-10-CM

## 2016-02-10 MED ORDER — CARVEDILOL 25 MG PO TABS: 25.0000 mg | ORAL_TABLET | Freq: Two times a day (BID) | ORAL | 3 refills | Status: DC

## 2016-02-10 NOTE — Patient Instructions (Signed)
Medication Instructions:  Your physician recommends that you continue on your current medications as directed. Please refer to the Current Medication list given to you today.   Labwork: None ordered   Testing/Procedures: Your physician has requested that you have an echocardiogram. Echocardiography is a painless test that uses sound waves to create images of your heart. It provides your doctor with information about the size and shape of your heart and how well your heart's chambers and valves are working. This procedure takes approximately one hour. There are no restrictions for this procedure.    Follow-Up: Your physician recommends that you schedule a follow-up appointment ASAP with Dr Linde GillisBensimhon--overdue and in 3 months with Dr Johney FrameAllred   Any Other Special Instructions Will Be Listed Below (If Applicable).     If you need a refill on your cardiac medications before your next appointment, please call your pharmacy.

## 2016-02-11 NOTE — Progress Notes (Signed)
Electrophysiology Office Note   Date:  02/11/2016   ID:  [name] Harrington, DOB 06/24/1965, MRN [id number]  PCP:  Ambrose FinlandValerie A Keck, NP  Cardiologist:  Dr Gala RomneyBensimhon Primary Electrophysiologist: Hillis RangeJames Josealfredo Adkins, MD    CC: CHF   History of Present Illness: [name] Harrington is a 50 y.o. male who presents today for electrophysiology evaluation.   He continues be noncompliant with CHF clinic and EP clinic follow-up.  He is currently out of his medicine. 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  HIs ICD has reached ERI and he has felt vibratory alerts.  Today, he denies symptoms of palpitations, chest pain, shortness of breath, orthopnea, PND, lower extremity edema, claudication, dizziness, presyncope, syncope, bleeding, or neurologic sequela. The patient is tolerating medications without difficulties and is otherwise without complaint today.    Past Medical History:  Diagnosis Date  . Alcohol abuse   . CHF (congestive heart failure) (HCC)    a. 09/2008 echo: EF 25%  . Gout   . LBBB (left bundle branch block)   . Nonischemic cardiomyopathy (HCC)    afib  . Pre-syncope   . Ventricular tachycardia (HCC)    a. cath 10/22/2008- NL Cors. EF 25% b. s/p St.  Jude current + DR dual chamber AICD 10/22/2008   Past Surgical History:  Procedure Laterality Date  . CLOSED REDUCTION NASAL FRACTURE  11/13/10   w internal and external stabilization  . fragment lag screw fixation    . IMPLANTABLE CARDIOVERTER DEFIBRILLATOR IMPLANT    . PACEMAKER PLACEMENT       Current Outpatient Prescriptions  Medication Sig Dispense Refill  . allopurinol (ZYLOPRIM) 300 MG tablet Take 1 tablet (300 mg total) by mouth daily. 30 tablet 2  . carvedilol (COREG) 25 MG tablet Take 1 tablet (25 mg total) by mouth 2 (two) times daily with a meal. 180 tablet 3  . furosemide (LASIX) 40 MG tablet Take 40 mg by mouth daily as needed (swelling).     Marland Kitchen. omeprazole (PRILOSEC) 20 MG capsule Take 20 mg by mouth daily.        No current facility-administered medications for this visit.     Allergies:   Ace inhibitors   Social History:  The patient  reports that he has never smoked. He does not have any smokeless tobacco history on file. He reports that he drinks alcohol. He reports that he does not use drugs.   Family History:  The patient's family history includes Breast cancer in his mother; Luiz BlareGraves' disease in his sister.    ROS:  Please see the history of present illness.   All other systems are reviewed and negative.    PHYSICAL EXAM: VS:  BP (!) 182/110   Pulse 91   Resp (!) 98   Ht 5\' 11"  (1.803 m)   Wt 216 lb (98 kg)   BMI 30.13 kg/m  , BMI Body mass index is 30.13 kg/m. GEN: Well nourished, well developed, in no acute distress  HEENT: normal  Neck: no JVD, carotid bruits, or masses Cardiac: RRR; no murmurs, rubs, or gallops,no edema  Respiratory:  clear to auscultation bilaterally, normal work of breathing GI: soft, nontender, nondistended, + BS MS: no deformity or atrophy  Skin: warm and dry, device pocket is well healed Neuro:  Strength and sensation are intact Psych: euthymic mood, full affect  Device interrogation is reviewed today in detail.  See PaceArt for details.    Lipid Panel     Component Value  Date/Time   CHOL 204 (H) 01/23/2011 0937   TRIG 241.0 (H) 01/23/2011 0937   HDL 59.40 01/23/2011 0937   CHOLHDL 3 01/23/2011 0937   VLDL 48.2 (H) 01/23/2011 0937   LDLCALC 75 06/05/2010 2039   LDLDIRECT 63.0 01/23/2011 0937     Wt Readings from Last 3 Encounters:  02/10/16 216 lb (98 kg)  07/30/14 215 lb 3.2 oz (97.6 kg)  08/17/13 212 lb (96.2 kg)     ASSESSMENT AND PLAN:  1.  Chronic systolic dysfunction/ nonischemic CM/ LBBB Doing well NYHA Class I He continues to be noncompiant with medicines and follow-up.  His ICD has reached ERI.  He has some noise on his atrial lead.  Ideally, we would upgrade to CRT. Today, I had a long discussion with the patient  about the importance if follow-up and medicine compliance.  We also discussed ICD system upgrade/ generator change at length. Unfortunately, he is very clear that he is not interested in device replacement at this time.  He reports that he was very sore for about a month after his initial device implant and does not feel that he wishes to have it replaced or upgraded at this time.  Echo is ordered to evaluate EF  See Pace Art report No changes today ETOH cessation advised  2. HTN Above goal Consider restarting losartan upon follow-up with CHF clinic I have restarted coreg today 2 gram sodium diet advised today  3. ETOH Cessation advised  4. VT Well controlled off of AAD therapy He understands risks of not having his ICD system replaced.  Refer back to CHF clinic.  I referred him back to CHF clinic 4/16 when I saw him last but he did not comply with this referral. Return to see me for additional discussions regarding possibly ICD system replacement/ upgrade in 3 months.  IF he decides to proceed prior to that time, he should contact my office.  Current medicines are reviewed at length with the patient today.   The patient does not have concerns regarding his medicines.  The following changes were made today:  none  Labs/ tests ordered today include:  Orders Placed This Encounter  Procedures  . ECHOCARDIOGRAM COMPLETE    Signed, Hillis Range, MD    [facility] [address] [place] [address] [phone] (office) [phone] (fax)

## 2016-02-14 NOTE — Addendum Note (Signed)
Addended by: Dennis BastLANIER, Lex Linhares F on: 02/14/2016 02:02 PM   Modules accepted: Orders

## 2016-03-17 ENCOUNTER — Other Ambulatory Visit: Payer: Self-pay

## 2016-03-17 ENCOUNTER — Ambulatory Visit (HOSPITAL_COMMUNITY)
Admission: RE | Admit: 2016-03-17 | Discharge: 2016-03-17 | Disposition: A | Discharge status: Home / Self Care | Payer: BLUE CROSS/BLUE SHIELD | Source: Ambulatory Visit | Attending: Cardiology | Admitting: Cardiology

## 2016-03-17 ENCOUNTER — Ambulatory Visit (HOSPITAL_BASED_OUTPATIENT_CLINIC_OR_DEPARTMENT_OTHER): Payer: BLUE CROSS/BLUE SHIELD

## 2016-03-17 VITALS — BP 192/120 | HR 69 | Wt 218.2 lb

## 2016-03-17 DIAGNOSIS — I10 Essential (primary) hypertension: Secondary | ICD-10-CM | POA: Diagnosis not present

## 2016-03-17 DIAGNOSIS — I472 Ventricular tachycardia: Secondary | ICD-10-CM | POA: Diagnosis not present

## 2016-03-17 DIAGNOSIS — I429 Cardiomyopathy, unspecified: Secondary | ICD-10-CM | POA: Insufficient documentation

## 2016-03-17 DIAGNOSIS — Z95810 Presence of automatic (implantable) cardiac defibrillator: Secondary | ICD-10-CM | POA: Insufficient documentation

## 2016-03-17 DIAGNOSIS — Z79899 Other long term (current) drug therapy: Secondary | ICD-10-CM | POA: Diagnosis not present

## 2016-03-17 DIAGNOSIS — I110 Hypertensive heart disease with heart failure: Secondary | ICD-10-CM | POA: Diagnosis not present

## 2016-03-17 DIAGNOSIS — I447 Left bundle-branch block, unspecified: Secondary | ICD-10-CM | POA: Insufficient documentation

## 2016-03-17 DIAGNOSIS — F1010 Alcohol abuse, uncomplicated: Secondary | ICD-10-CM | POA: Diagnosis not present

## 2016-03-17 DIAGNOSIS — I5022 Chronic systolic (congestive) heart failure: Secondary | ICD-10-CM

## 2016-03-17 DIAGNOSIS — I4729 Other ventricular tachycardia: Secondary | ICD-10-CM

## 2016-03-17 DIAGNOSIS — I4891 Unspecified atrial fibrillation: Secondary | ICD-10-CM | POA: Diagnosis not present

## 2016-03-17 DIAGNOSIS — M109 Gout, unspecified: Secondary | ICD-10-CM | POA: Diagnosis not present

## 2016-03-17 LAB — ECHOCARDIOGRAM COMPLETE: WEIGHTICAEL: 3491.2 [oz_av]

## 2016-03-17 MED ORDER — ISOSORB DINITRATE-HYDRALAZINE 20-37.5 MG PO TABS: 1.0000 | ORAL_TABLET | Freq: Three times a day (TID) | ORAL | 3 refills | Status: DC

## 2016-03-17 NOTE — Progress Notes (Signed)
Advanced Heart Failure Medication Review by a Pharmacist  Does the patient  feel that his/her medications are working for him/her?  yes  Has the patient been experiencing any side effects to the medications prescribed?  no  Does the patient measure his/her own blood pressure or blood glucose at home?  no   Does the patient have any problems obtaining medications due to transportation or finances?   no  Understanding of regimen: good Understanding of indications: good Potential of compliance: good Patient understands to avoid NSAIDs. Patient understands to avoid decongestants.  Issues to address at subsequent visits: None   Pharmacist comments:  Mr. Inda MerlinHickman is a pleasant 50 yo M presenting without a medication list but with good recall of his regimen. He reports good compliance with his regimen and did not have any specific medication-related questions or concerns for me at this time.   Tyler DeisErika K. Bonnye FavaNicolsen, PharmD, BCPS, CPP Clinical Pharmacist Pager: [phone] Phone: [phone] 03/17/2016 1:56 PM      Time with patient: 10 minutes Preparation and documentation time: 2 minutes Total time: 12 minutes

## 2016-03-17 NOTE — Progress Notes (Addendum)
Patient ID: [name] CleaverMarcus Holley, male   DOB: [birth year], 50 y.o.   MRN: [id number] PCP: Dr Wayland DenisHassain- Jovita KussmaulEvans-Blount EP: Dr Johney FrameAllred  HPI: [name] is a 50 year old male with h/o CHF due to non-ischemic CM EF 20-25% s/p ICD placement (ST Jude 2010). He also has h/o VT, chronic LBBB and ETOH abuse.  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    He returns for HF follow up. He has not been seen in the HF clinic since 2014[facility]. Overall feeling good. Denies  SOB/PND/Orthopnea. Not weighing at home. Drinks a beer or 2 on the weekend.  Working full time for a [employer] .   01/23/2011 ECHO EF 20-25% 10/22/11 ECHO EF 25 %  CPX test 3/13:  FEV1/FVC 60%  pVO2 = 23.4 (71%) - not corrected for weight Slope 26 RER 0.98 Ve/MVV = 74% (lung limited) O2 pulse 100%  ROS: All systems negative except as listed in HPI, PMH and Problem List.  Past Medical History:  Diagnosis Date  . Alcohol abuse   . CHF (congestive heart failure) (HCC)    a. 09/2008 echo: EF 25%  . Gout   . LBBB (left bundle branch block)   . Nonischemic cardiomyopathy (HCC)    afib  . Pre-syncope   . Ventricular tachycardia (HCC)    a. cath 10/22/2008- NL Cors. EF 25% b. s/p St.  Jude current + DR dual chamber AICD 10/22/2008    Current Outpatient Prescriptions  Medication Sig Dispense Refill  . allopurinol (ZYLOPRIM) 300 MG tablet Take 1 tablet (300 mg total) by mouth daily. 30 tablet 2  . carvedilol (COREG) 25 MG tablet Take 1 tablet (25 mg total) by mouth 2 (two) times daily with a meal. 180 tablet 3  . furosemide (LASIX) 40 MG tablet Take 40 mg by mouth daily as needed (swelling).     Marland Kitchen. omeprazole (PRILOSEC) 20 MG capsule Take 20 mg by mouth daily.       No current facility-administered medications for this encounter.     PHYSICAL EXAM: Vitals:   03/17/16 1348  BP: (!) 192/120  BP Location: Left Arm  Patient Position: Sitting  Cuff Size: Normal  Pulse: 69  SpO2: 99%  Weight: 218 lb 3.2 oz  (99 kg)   General:  Well appearing. No resp difficulty. Ambulated in the clinic without difficulty.  HEENT: normal Neck: supple. JVP flat. Carotids 2+ bilaterally; no bruits. No lymphadenopathy or thryomegaly appreciated. Cor: PMI normal. Distant. Regular rate & rhythm. No rubs, gallops or murmurs. Lungs: clear with decreased BS throughout. No wheeze.  Abdomen: obese soft, nontender, nondistended. No hepatosplenomegaly. No bruits or masses. Good bowel sounds. Extremities: no cyanosis, clubbing, rash, edema Neuro: alert & orientedx3, cranial nerves grossly intact. Moves all 4 extremities w/o difficulty. Affect pleasant.  ASSESSMENT & PLAN:  1. Chronic Systolic Heart Failure - NICM  Last ECHO 2014. Repeat ECHO today[facility] He does not want pursue.  NYHA I. Volume status stable. Continue lasix as needed.  Continue carvedilol 25 mg twice a day.  H/O angio edema. For this reason he will not be placed on entresto/ace.  Add bidil 1 tab three times a day. Provided assistance card for Bidil  Would not place on spiro due to compliance concerns.  2. Alcohol Abuse - Doing much better. Limiting alcohol use.  3. HTN elevated- Continue carvedilol at current dose. Add bidil as above. Stressed importance of medication compliance.  4/  [facility]. 5. LBBB  I have asked him to follow up in 2 weeks for BP check however he declines. Follow up in 4 weeks.   Amy Clegg NP-C  2:25 PM

## 2016-03-17 NOTE — Patient Instructions (Signed)
START Bidil 1 tab three times daily (every 8 hours). Use Copay card at preferred pharmacy.  If Rx is greater than $15, have electronic Rx transferred to specialty pharmacy on list given.  Follow up 1 month with Amy Clegg NP-C.  Do the following things EVERYDAY: 1) Weigh yourself in the morning before breakfast. Write it down and keep it in a log. 2) Take your medicines as prescribed 3) Eat low salt foods-Limit salt (sodium) to 2000 mg per day.  4) Stay as active as you can everyday 5) Limit all fluids for the day to less than 2 liters

## 2016-03-24 ENCOUNTER — Encounter (HOSPITAL_COMMUNITY): Payer: Self-pay | Admitting: Pharmacist

## 2016-03-24 ENCOUNTER — Telehealth (HOSPITAL_COMMUNITY): Payer: Self-pay | Admitting: Pharmacist

## 2016-03-24 NOTE — Telephone Encounter (Signed)
Patient called stating insurance is not covering Bidil. [facility] [place] but am sending in appeal today. Will provide him with samples in the meantime.   Medication Samples have been provided to the patient.  Drug name: Bidil       Strength: 20-37.5 mg        Qty: 36  LOT: [id number]  Exp.Date: 10/2016  Dosing instructions: Take 1 tablet three times daily   The patient has been instructed regarding the correct time, dose, and frequency of taking this medication, including desired effects and most common side effects.    Tyler DeisErika K. Bonnye FavaNicolsen, PharmD, BCPS, CPP Clinical Pharmacist Pager: [phone] Phone: [phone] 03/24/2016 3:42 PM

## 2016-04-14 ENCOUNTER — Encounter (HOSPITAL_COMMUNITY): Payer: BLUE CROSS/BLUE SHIELD

## 2016-04-24 LAB — CUP PACEART INCLINIC DEVICE CHECK
Battery Voltage: 2.39 V
Brady Statistic RA Percent Paced: 3.3 %
HighPow Impedance: 53.4418
Implantable Lead Location: 753860
Implantable Lead Model: 7121
Implantable Pulse Generator Implant Date: 20100628
Lead Channel Pacing Threshold Amplitude: 0.75 V
Lead Channel Sensing Intrinsic Amplitude: 1.5 mV
Lead Channel Setting Pacing Amplitude: 2.5 V
Lead Channel Setting Pacing Pulse Width: 0.5 ms
MDC IDC LEAD IMPLANT DT: 20100628
MDC IDC LEAD IMPLANT DT: 20100628
MDC IDC LEAD LOCATION: 753859
MDC IDC MSMT BATTERY REMAINING LONGEVITY: 0 mo
MDC IDC MSMT LEADCHNL RA IMPEDANCE VALUE: 312.5 Ohm
MDC IDC MSMT LEADCHNL RA PACING THRESHOLD PULSEWIDTH: 0.5 ms
MDC IDC MSMT LEADCHNL RV IMPEDANCE VALUE: 387.5 Ohm
MDC IDC MSMT LEADCHNL RV PACING THRESHOLD AMPLITUDE: 1.25 V
MDC IDC MSMT LEADCHNL RV PACING THRESHOLD PULSEWIDTH: 0.5 ms
MDC IDC MSMT LEADCHNL RV SENSING INTR AMPL: 12 mV
MDC IDC PG SERIAL: 475704
MDC IDC SESS DTM: 20171016164047
MDC IDC SET LEADCHNL RA PACING AMPLITUDE: 2 V
MDC IDC SET LEADCHNL RV SENSING SENSITIVITY: 0.3 mV
MDC IDC STAT BRADY RV PERCENT PACED: 0.72 %

## 2016-04-29 ENCOUNTER — Ambulatory Visit (HOSPITAL_COMMUNITY)
Admission: RE | Admit: 2016-04-29 | Discharge: 2016-04-29 | Disposition: A | Discharge status: Home / Self Care | Payer: BLUE CROSS/BLUE SHIELD | Source: Ambulatory Visit | Attending: Cardiology | Admitting: Cardiology

## 2016-04-29 VITALS — BP 224/122 | HR 103 | Wt 224.8 lb

## 2016-04-29 DIAGNOSIS — R0602 Shortness of breath: Secondary | ICD-10-CM | POA: Insufficient documentation

## 2016-04-29 DIAGNOSIS — Z95810 Presence of automatic (implantable) cardiac defibrillator: Secondary | ICD-10-CM | POA: Diagnosis not present

## 2016-04-29 DIAGNOSIS — I447 Left bundle-branch block, unspecified: Secondary | ICD-10-CM | POA: Insufficient documentation

## 2016-04-29 DIAGNOSIS — I5022 Chronic systolic (congestive) heart failure: Secondary | ICD-10-CM | POA: Diagnosis not present

## 2016-04-29 DIAGNOSIS — I10 Essential (primary) hypertension: Secondary | ICD-10-CM | POA: Diagnosis not present

## 2016-04-29 DIAGNOSIS — F1010 Alcohol abuse, uncomplicated: Secondary | ICD-10-CM | POA: Diagnosis not present

## 2016-04-29 DIAGNOSIS — I110 Hypertensive heart disease with heart failure: Secondary | ICD-10-CM | POA: Diagnosis not present

## 2016-04-29 DIAGNOSIS — I472 Ventricular tachycardia: Secondary | ICD-10-CM | POA: Diagnosis not present

## 2016-04-29 DIAGNOSIS — I4891 Unspecified atrial fibrillation: Secondary | ICD-10-CM | POA: Diagnosis not present

## 2016-04-29 DIAGNOSIS — I4729 Other ventricular tachycardia: Secondary | ICD-10-CM

## 2016-04-29 DIAGNOSIS — I429 Cardiomyopathy, unspecified: Secondary | ICD-10-CM | POA: Insufficient documentation

## 2016-04-29 DIAGNOSIS — M109 Gout, unspecified: Secondary | ICD-10-CM | POA: Insufficient documentation

## 2016-04-29 DIAGNOSIS — Z79899 Other long term (current) drug therapy: Secondary | ICD-10-CM | POA: Diagnosis not present

## 2016-04-29 LAB — BASIC METABOLIC PANEL
ANION GAP: 10 (ref 5–15)
BUN: 10 mg/dL (ref 6–20)
CO2: 23 mmol/L (ref 22–32)
Calcium: 9.5 mg/dL (ref 8.9–10.3)
Chloride: 104 mmol/L (ref 101–111)
Creatinine, Ser: 0.96 mg/dL (ref 0.61–1.24)
Glucose, Bld: 107 mg/dL — ABNORMAL HIGH (ref 65–99)
POTASSIUM: 4.5 mmol/L (ref 3.5–5.1)
Sodium: 137 mmol/L (ref 135–145)

## 2016-04-29 LAB — BRAIN NATRIURETIC PEPTIDE: B Natriuretic Peptide: 1098.3 pg/mL — ABNORMAL HIGH (ref 0.0–100.0)

## 2016-04-29 MED ORDER — HYDRALAZINE HCL 50 MG PO TABS
75.0000 mg | ORAL_TABLET | Freq: Once | ORAL | Status: AC
  Administered 2016-04-29: 75 mg via ORAL
  Filled 2016-04-29: qty 1

## 2016-04-29 MED ORDER — HYDRALAZINE HCL 50 MG PO TABS: 50.0000 mg | ORAL_TABLET | Freq: Three times a day (TID) | ORAL | 3 refills | Status: DC

## 2016-04-29 MED ORDER — SPIRONOLACTONE 25 MG PO TABS: 12.5000 mg | ORAL_TABLET | Freq: Every day | ORAL | 3 refills | Status: DC

## 2016-04-29 MED ORDER — ISOSORBIDE MONONITRATE ER 30 MG PO TB24: 30.0000 mg | ORAL_TABLET | Freq: Every day | ORAL | 3 refills | Status: AC

## 2016-04-29 NOTE — Progress Notes (Signed)
Patient ID: [name] Harrington, male   DOB: 1966/01/02, 51 y.o.   MRN: [id number] PCP: Dr Wayland DenisHassain- Jovita KussmaulEvans-Blount EP: Dr Johney FrameAllred  HPI: [name] Harrington is a 51 year old male with h/o CHF due to non-ischemic CM EF 20-25% s/p ICD placement (ST Jude 2010). He also has h/o VT, chronic LBBB and ETOH abuse.  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    He returns for HF follow up. Last visit bidil was added but he ran out about 2 weeks ago. Once he started bidil he stopped carvedilol. He has since started the carvedilol started again 2 weeks ago. [facility] and he does not want to replace due to pain associated with his first ICD. Overall feeling good. Denies SOB/PND/Orthopnea. Does admit admit to SOB with inclines and steps. ot weighing at home. Continues to drink 1-2 beers on the weekend.  Working full time for a [employer] .   01/23/2011 ECHO EF 20-25% 10/22/11 ECHO EF 25 % 02/2016: ECHO EF 10-15%.   CPX test 3/13:  FEV1/FVC 60%  pVO2 = 23.4 (71%) - not corrected for weight Slope 26 RER 0.98 Ve/MVV = 74% (lung limited) O2 pulse 100%  ROS: All systems negative except as listed in HPI, PMH and Problem List.  Past Medical History:  Diagnosis Date  . Alcohol abuse   . CHF (congestive heart failure) (HCC)    a. 09/2008 echo: EF 25%  . Gout   . LBBB (left bundle branch block)   . Nonischemic cardiomyopathy (HCC)    afib  . Pre-syncope   . Ventricular tachycardia (HCC)    a. cath 10/22/2008- NL Cors. EF 25% b. s/p St.  Jude current + DR dual chamber AICD 10/22/2008    Current Outpatient Prescriptions  Medication Sig Dispense Refill  . allopurinol (ZYLOPRIM) 300 MG tablet Take 1 tablet (300 mg total) by mouth daily. 30 tablet 2  . carvedilol (COREG) 25 MG tablet Take 1 tablet (25 mg total) by mouth 2 (two) times daily with a meal. 180 tablet 3  . furosemide (LASIX) 40 MG tablet Take 40 mg by mouth daily as needed (swelling).     Marland Kitchen. omeprazole (PRILOSEC) 20 MG capsule Take 20 mg by mouth daily.        Current Facility-Administered Medications  Medication Dose Route Frequency Provider Last Rate Last Dose  . hydrALAZINE (APRESOLINE) tablet 75 mg  75 mg Oral Once Amy D Filbert Schilderlegg, NP        PHYSICAL EXAM: Vitals:   04/29/16 1451  BP: (!) 214/144  BP Location: Left Arm  Patient Position: Sitting  Cuff Size: Normal  Pulse: (!) 103  SpO2: 99%  Weight: 224 lb 12.8 oz (102 kg)   General:  Well appearing. No resp difficulty. Ambulated in the clinic without difficulty.  HEENT: normal Neck: supple. JVP 6-7. Carotids 2+ bilaterally; no bruits. No lymphadenopathy or thryomegaly appreciated. Cor: PMI normal. Distant. Regular rate & rhythm. No rubs, or murmurs. Soft S3 Lungs: clear with decreased BS throughout. No wheeze.  Abdomen: obese soft, nontender, nondistended. No hepatosplenomegaly. No bruits or masses. Good bowel sounds. Extremities: no cyanosis, clubbing, rash, edema Neuro: alert & orientedx3, cranial nerves grossly intact. Moves all 4 extremities w/o difficulty. Affect pleasant.  ASSESSMENT & PLAN:  1. Chronic Systolic Heart Failure - NICM  ECHO 02/2016 EF 10-15%. [facility]. Need upgrade to CRT-D however he is not sure he would like to pursue. He saw Allred in November. Today we discussed that he is at  risk for SCD and that his EF is worse. He would benefit from CRT-D and this may improve EF. He will consider and let me know I will get him back in with EP. I also called Marine scientist NP with with EP to discuss potential upgrade with plans to refer back once he agrees.  NYHA II. Volume status stable. Continue lasix as needed.  Continue carvedilol 25 mg twice a day.  H/O angio edema with Ace Inhibitor. For this reason he will not be placed on entresto/ace .Unable to afford bidil. Today I will start hydralazine 50 mg three times a day + 30 mg imdur daily.Add 12.5 mg spiro daily.  Lengthy discussion regarding medication compliance as he was only taking some of his HF meds.    2. Alcohol  Abuse -  Limiting alcohol use.  3. HTN elevated- Given 50 mg hydralazine in the clinic. As above restarting hydralazine/imdur/spiro.  4/ VT- ICD at [facility] does not want to replace as above.  5. LBBB- He would benefit from ICD upgrade.    Follow up 2 weeks. Plan to check BMET at that time. Plan to discuss ICD again.  >25 minutes spent discussing the above.  Amy Clegg NP-C  3:18 PM

## 2016-04-29 NOTE — Patient Instructions (Signed)
Routine lab work today. Will notify you of abnormal results, otherwise no news is good news!  START Spironolactone 12.5 mg (1/2 tablet) once daily.  START Hydralazine 50 mg (1 tablet) three times daily.  START Imdur 30 mg (1 tablet) once daily.  Follow up 2 weeks with Amy Clegg NP-C.  Do the following things EVERYDAY: 1) Weigh yourself in the morning before breakfast. Write it down and keep it in a log. 2) Take your medicines as prescribed 3) Eat low salt foods-Limit salt (sodium) to 2000 mg per day.  4) Stay as active as you can everyday 5) Limit all fluids for the day to less than 2 liters

## 2016-05-06 ENCOUNTER — Telehealth (HOSPITAL_COMMUNITY): Payer: Self-pay | Admitting: Pharmacist

## 2016-05-06 NOTE — Telephone Encounter (Signed)
[facility] patient assistance DENIED for Bidil. Will continue on hydral/imdur.   Tyler DeisErika K. Bonnye FavaNicolsen, PharmD, BCPS, CPP Clinical Pharmacist Pager: [phone] Phone: [phone] 05/06/2016 11:18 AM

## 2016-05-13 ENCOUNTER — Encounter: Payer: BLUE CROSS/BLUE SHIELD | Admitting: Internal Medicine

## 2016-05-13 ENCOUNTER — Encounter (HOSPITAL_COMMUNITY): Payer: BLUE CROSS/BLUE SHIELD

## 2016-05-27 ENCOUNTER — Encounter: Payer: BLUE CROSS/BLUE SHIELD | Admitting: Internal Medicine

## 2016-05-27 ENCOUNTER — Encounter (HOSPITAL_COMMUNITY): Payer: BLUE CROSS/BLUE SHIELD

## 2016-06-02 ENCOUNTER — Telehealth (HOSPITAL_COMMUNITY): Payer: Self-pay | Admitting: Internal Medicine

## 2016-06-02 NOTE — Telephone Encounter (Signed)
Called pt several time with no answer and no machine to leave a message.  We needed to reschedule patient's 06/09/16 appt due to VAD JCAHO.  Appt was rescheduled and a letter was mailed to patient with new appt date & time.

## 2016-06-09 ENCOUNTER — Encounter (HOSPITAL_COMMUNITY): Payer: BLUE CROSS/BLUE SHIELD

## 2016-06-23 ENCOUNTER — Encounter (HOSPITAL_COMMUNITY): Payer: BLUE CROSS/BLUE SHIELD

## 2016-06-24 ENCOUNTER — Encounter: Payer: Self-pay | Admitting: *Deleted

## 2016-07-01 ENCOUNTER — Encounter (HOSPITAL_COMMUNITY): Payer: BLUE CROSS/BLUE SHIELD

## 2016-07-01 ENCOUNTER — Encounter: Payer: BLUE CROSS/BLUE SHIELD | Admitting: Internal Medicine

## 2016-07-02 ENCOUNTER — Encounter: Payer: Self-pay | Admitting: Internal Medicine

## 2016-07-08 ENCOUNTER — Telehealth: Payer: Self-pay | Admitting: Licensed Clinical Social Worker

## 2016-07-08 NOTE — Telephone Encounter (Signed)
CSW received referral to follow up with patient as he has cancelled and rescheduled HF clinic appointments multiple times. CSW requested to explore reasons with patient and attempt to offer solutions if possible. CSW attempted multiple times to reach patient by phone with no answer or ability to leave message. CSW available if patient returns call to clinic as he was told he would need to speak with CSW to review multiple clinic appointment cancellations. Lasandra BeechJackie Alayah Knouff, LCSW, CCSW-MCS [phone]

## 2016-07-28 ENCOUNTER — Encounter: Payer: Self-pay | Admitting: Cardiology

## 2016-11-06 ENCOUNTER — Ambulatory Visit (HOSPITAL_COMMUNITY)
Admission: EM | Admit: 2016-11-06 | Discharge: 2016-11-06 | Disposition: A | Discharge status: Home / Self Care | Payer: BLUE CROSS/BLUE SHIELD | Attending: Internal Medicine | Admitting: Internal Medicine

## 2016-11-06 ENCOUNTER — Encounter (HOSPITAL_COMMUNITY): Payer: Self-pay

## 2016-11-06 DIAGNOSIS — S01111A Laceration without foreign body of right eyelid and periocular area, initial encounter: Secondary | ICD-10-CM | POA: Diagnosis not present

## 2016-11-06 DIAGNOSIS — M109 Gout, unspecified: Secondary | ICD-10-CM

## 2016-11-06 DIAGNOSIS — Z23 Encounter for immunization: Secondary | ICD-10-CM | POA: Diagnosis not present

## 2016-11-06 MED ORDER — ALLOPURINOL 300 MG PO TABS: 300.0000 mg | ORAL_TABLET | Freq: Every day | ORAL | 0 refills | Status: AC

## 2016-11-06 MED ORDER — LIDOCAINE HCL 2 % IJ SOLN
INTRAMUSCULAR | Status: AC
  Filled 2016-11-06: qty 20

## 2016-11-06 MED ORDER — TETANUS-DIPHTH-ACELL PERTUSSIS 5-2.5-18.5 LF-MCG/0.5 IM SUSP
INTRAMUSCULAR | Status: AC
  Filled 2016-11-06: qty 0.5

## 2016-11-06 MED ORDER — TETANUS-DIPHTH-ACELL PERTUSSIS 5-2.5-18.5 LF-MCG/0.5 IM SUSP
0.5000 mL | Freq: Once | INTRAMUSCULAR | Status: AC
  Administered 2016-11-06: 0.5 mL via INTRAMUSCULAR

## 2016-11-06 MED ORDER — BACITRACIN ZINC 500 UNIT/GM EX OINT
TOPICAL_OINTMENT | CUTANEOUS | Status: AC
  Filled 2016-11-06: qty 0.9
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
## 2016-11-12 ENCOUNTER — Encounter (HOSPITAL_COMMUNITY): Payer: Self-pay

## 2016-11-12 ENCOUNTER — Ambulatory Visit (HOSPITAL_COMMUNITY)
Admission: EM | Admit: 2016-11-12 | Discharge: 2016-11-12 | Disposition: A | Discharge status: Home / Self Care | Payer: BLUE CROSS/BLUE SHIELD | Attending: Family Medicine | Admitting: Family Medicine

## 2016-11-12 DIAGNOSIS — Z4802 Encounter for removal of sutures: Secondary | ICD-10-CM | POA: Diagnosis not present

## 2016-11-12 NOTE — ED Triage Notes (Signed)
Pt here for suture removal that's located above his right eye.

## 2016-11-13 NOTE — ED Provider Notes (Signed)
  [facility]   [id number] 11/12/16 Arrival Time: 1658  ASSESSMENT & PLAN:  Sutures removed without complication.   Reviewed expectations re: course of current medical issues. Questions answered. Outlined signs and symptoms indicating need for more acute intervention. Patient verbalized understanding. After Visit Summary given.   SUBJECTIVE:  [name] Harrington is a 51 y.o. male who presents for suture removal. Previous notes reviewed.  ROS: As per HPI.   OBJECTIVE:  Vitals:   11/12/16 1739 11/12/16 1740  BP: (!) 180/107 (!) 175/99  Pulse: 73   Resp: 18   Temp: 98.4 F (36.9 C)   TempSrc: Oral   SpO2: 96%      General appearance: alert; no distress Sutures above R eye. Healed well.   Allergies  Allergen Reactions  . Ace Inhibitors Shortness Of Breath    REACTION: angioedema - facial swelling and difficulty breathing    PMHx, SurgHx, SocialHx, Medications, and Allergies were reviewed in the Visit Navigator and updated as appropriate.      Mardella LaymanHagler, Jaaliyah Lucatero, MD 11/13/16 1310

## 2017-06-01 ENCOUNTER — Encounter (HOSPITAL_COMMUNITY): Payer: Self-pay | Admitting: Emergency Medicine

## 2017-06-01 ENCOUNTER — Ambulatory Visit (HOSPITAL_COMMUNITY)
Admission: EM | Admit: 2017-06-01 | Discharge: 2017-06-01 | Disposition: A | Discharge status: Home / Self Care | Payer: BLUE CROSS/BLUE SHIELD | Attending: Physician Assistant | Admitting: Physician Assistant

## 2017-06-01 DIAGNOSIS — I10 Essential (primary) hypertension: Secondary | ICD-10-CM

## 2017-06-01 DIAGNOSIS — Z8739 Personal history of other diseases of the musculoskeletal system and connective tissue: Secondary | ICD-10-CM | POA: Diagnosis not present

## 2017-06-01 DIAGNOSIS — M25461 Effusion, right knee: Secondary | ICD-10-CM | POA: Diagnosis not present

## 2017-06-01 MED ORDER — PREDNISONE 20 MG PO TABS: ORAL_TABLET | ORAL | 0 refills | Status: AC

## 2017-06-01 MED ORDER — CARVEDILOL 25 MG PO TABS: 25.0000 mg | ORAL_TABLET | Freq: Two times a day (BID) | ORAL | 0 refills | Status: AC

## 2017-06-01 NOTE — ED Provider Notes (Signed)
06/01/2017 7:51 PM   DOB: 08-24-1965 / MRN: [id number]  SUBJECTIVE:  [name] Harrington is a 52 y.o. male presenting for gout flare in the right knee that started about 1 week ago and is worsening.  Similar to previous episodes.  No history of diabetes.   He is allergic to ace inhibitors.   He  has a past medical history of Alcohol abuse, CHF (congestive heart failure) (HCC), Gout, LBBB (left bundle branch block), Nonischemic cardiomyopathy (HCC), Pre-syncope, and Ventricular tachycardia (HCC).    He  reports that  has never smoked. he has never used smokeless tobacco. He reports that he drinks alcohol. He reports that he does not use drugs. He  reports that he currently engages in sexual activity. The patient  has a past surgical history that includes Closed reduction nasal fracture (11/13/10); fragment lag screw fixation; pacemaker placement; and Implantable cardioverter defibrillator implant.  His family history includes Breast cancer (age of onset: 87) in his mother; Luiz Blare' disease in his sister.  Review of Systems  Constitutional: Negative for chills, diaphoresis and fever.  Respiratory: Negative for cough, hemoptysis, sputum production, shortness of breath and wheezing.   Cardiovascular: Negative for chest pain, orthopnea and leg swelling.  Gastrointestinal: Negative for nausea.  Skin: Negative for rash.  Neurological: Negative for dizziness.    OBJECTIVE:  BP (!) 180/113   Pulse 99   Temp 98.8 F (37.1 C) (Oral)   Resp 16   Wt 210 lb (95.3 kg)   SpO2 100%   BMI 29.29 kg/m   Physical Exam  Constitutional: He appears well-developed. He is active and cooperative.  Non-toxic appearance.  Cardiovascular: Normal rate, regular rhythm and normal heart sounds.  Pulmonary/Chest: Effort normal and breath sounds normal. No tachypnea.  Musculoskeletal: He exhibits tenderness (right knee, with significant effusion. No bruising or redness. ).  Neurological: He is alert.  Skin: Skin is warm  and dry. He is not diaphoretic. No pallor.  Vitals reviewed.   Lab Results  Component Value Date   CREATININE 0.96 04/29/2016   BUN 10 04/29/2016   NA 137 04/29/2016   K 4.5 04/29/2016   CL 104 04/29/2016   CO2 23 04/29/2016   Procedure: Risk and benefits discussed and verbal consent obtained.  Allergies reviewed. Sterile prep with betadine and drape. Skin overlying right knee anethetized with 2% lido with.  18 gauge needle inserted into the suprapatellar bursa using a lateral approach.  30 cc of clear yellow fluid drained off. Pt with immediate reduction in symptoms with ambulation.     No results found for this or any previous visit (from the past 72 hour(s)).  No results found.  ASSESSMENT AND PLAN:  No orders of the defined types were placed in this encounter.    Knee effusion, right: Thought to be secondary to gout. Colchicine not on his formulary.  He has no history of gout and NSAIDS not advisable given his BP. He is requesting COREG refills today and states "i'm running low."  Starting pred tomorrow after he takes his medication tonight.  He assures me he will take his medication tonight and call his primary tomorrow for follow up of this problem and his HTN.   Hypertension, unspecified type - Plan: carvedilol (COREG) 25 MG tablet  History of gout      The patient is advised to call or return to clinic if he does not see an improvement in symptoms, or to seek the care of the closest emergency department if he  worsens with the above plan.   Deliah BostonMichael Clark, MHS, PA-C 06/01/2017 7:51 PM    Ofilia Neaslark, Michael L, PA-C 06/01/17 Babette Relic1959

## 2017-06-01 NOTE — ED Triage Notes (Signed)
PT reports he has not taken BP meds since Friday.  PT is not out

## 2017-06-01 NOTE — ED Triage Notes (Signed)
PT reports gout in right knee for 3 days.

## 2017-06-01 NOTE — Discharge Instructions (Signed)
Please take you BP medication as prescribed.  I would suggest starting the prednisone tomorrow morning after your medications are back in your body.  Colchicine is not covered under your insurance.  Don't take NSAIDS as this could hurt your heart.

## 2017-07-20 ENCOUNTER — Encounter (HOSPITAL_COMMUNITY): Payer: Self-pay | Admitting: Family Medicine

## 2017-07-20 ENCOUNTER — Ambulatory Visit (HOSPITAL_COMMUNITY)
Admission: EM | Admit: 2017-07-20 | Discharge: 2017-07-20 | Disposition: A | Discharge status: Home / Self Care | Payer: BLUE CROSS/BLUE SHIELD | Attending: Family Medicine | Admitting: Family Medicine

## 2017-07-20 DIAGNOSIS — M109 Gout, unspecified: Secondary | ICD-10-CM

## 2017-07-20 MED ORDER — INDOMETHACIN 50 MG PO CAPS: 50.0000 mg | ORAL_CAPSULE | Freq: Three times a day (TID) | ORAL | 0 refills | Status: DC | PRN

## 2017-07-20 MED ORDER — COLCHICINE 0.6 MG PO TABS: ORAL_TABLET | ORAL | 0 refills | Status: DC

## 2017-07-20 NOTE — ED Triage Notes (Signed)
Pt here for gout flare x 1 week. He has been taking allopurinol but not helping. He says the prednisone doesn't usually work but indomethacin does.

## 2017-07-25 ENCOUNTER — Other Ambulatory Visit: Payer: Self-pay | Admitting: Physician Assistant

## 2017-07-25 DIAGNOSIS — I10 Essential (primary) hypertension: Secondary | ICD-10-CM

## 2017-07-26 NOTE — Telephone Encounter (Signed)
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

## 2017-07-26 NOTE — Telephone Encounter (Signed)
[facility]    On [address]  [place]  And  Notified  That [place]   had  Never  Seen  The patient  - they  [place] SpanielSaid  They  Would  Notify  Him.

## 2017-07-27 NOTE — ED Provider Notes (Signed)
[facility]   [id number] 07/20/17 Arrival Time: 1543  ASSESSMENT & PLAN:  1. Acute gout of right knee, unspecified cause     Meds ordered this encounter  Medications  . indomethacin (INDOCIN) 50 MG capsule    Sig: Take 1 capsule (50 mg total) by mouth 3 (three) times daily as needed.    Dispense:  20 capsule    Refill:  0  . colchicine 0.6 MG tablet    Sig: Take 1-2 tablets by mouth. May repeat one table after 2 hours.    Dispense:  6 tablet    Refill:  0   Will follow up with PCP or here if worsening or failing to improve as anticipated.  Reviewed expectations re: course of current medical issues. Questions answered. Outlined signs and symptoms indicating need for more acute intervention. Patient verbalized understanding. After Visit Summary given.  SUBJECTIVE: History from: patient. Zakary Kimura is a 52 y.o. male who reports fairly persistent localized mild to moderate pain of his right knee that is stable; described as aching without radiation. Onset: gradual, a week ago. Injury/trama: no. Relieved by: nothing in particular. Worsened by: certain movements. Associated symptoms: none reported. Extremity sensation changes or weakness: none. Self treatment: has not tried OTCs for relief of pain. History of similar: yes, gout in this knee and "it feels the same"  ROS: As per HPI.   OBJECTIVE:  Vitals:   07/20/17 1627  BP: (!) 112/45  Pulse: 68  Resp: 18  Temp: 98.2 F (36.8 C)  SpO2: 100%    General appearance: alert; no distress Extremities: no cyanosis or edema; symmetrical with no gross deformities; tenderness over his right knee that is poorly localized with mild swelling and no bruising; ROM: normal but with discomfort; no sign of infection CV: normal extremity capillary refill Skin: warm and dry Neurologic: normal gait; normal symmetric reflexes in all extremities; normal sensation in all extremities Psychological: alert and cooperative;  normal mood and affect  Allergies  Allergen Reactions  . Ace Inhibitors Shortness Of Breath    REACTION: angioedema - facial swelling and difficulty breathing    Past Medical History:  Diagnosis Date  . Alcohol abuse   . CHF (congestive heart failure) (HCC)    a. 09/2008 echo: EF 25%  . Gout   . LBBB (left bundle branch block)   . Nonischemic cardiomyopathy (HCC)    afib  . Pre-syncope   . Ventricular tachycardia (HCC)    a. cath 10/22/2008- NL Cors. EF 25% b. s/p St.  Jude current + DR dual chamber AICD 10/22/2008   Social History   Socioeconomic History  . Marital status: Single    Spouse name: Not on file  . Number of children: Not on file  . Years of education: Not on file  . Highest education level: Not on file  Occupational History  . Not on file  Social Needs  . Financial resource strain: Not on file  . Food insecurity:    Worry: Not on file    Inability: Not on file  . Transportation needs:    Medical: Not on file    Non-medical: Not on file  Tobacco Use  . Smoking status: Never Smoker  . Smokeless tobacco: Never Used  Substance and Sexual Activity  . Alcohol use: Yes    Comment: frequently  . Drug use: No  . Sexual activity: Yes  Lifestyle  . Physical activity:    Days per week: Not on file  Minutes per session: Not on file  . Stress: Not on file  Relationships  . Social connections:    Talks on phone: Not on file    Gets together: Not on file    Attends religious service: Not on file    Active member of club or organization: Not on file    Attends meetings of clubs or organizations: Not on file    Relationship status: Not on file  . Intimate partner violence:    Fear of current or ex partner: Not on file    Emotionally abused: Not on file    Physically abused: Not on file    Forced sexual activity: Not on file  Other Topics Concern  . Not on file  Social History Narrative   Unemployed [place]. Drinks alcohol  frequently    Family History  Problem Relation Age of Onset  . Breast cancer Mother [id number]       hx  . Graves' disease Sister    Past Surgical History:  Procedure Laterality Date  . CLOSED REDUCTION NASAL FRACTURE  11/13/10   w internal and external stabilization  . fragment lag screw fixation    . IMPLANTABLE CARDIOVERTER DEFIBRILLATOR IMPLANT    . PACEMAKER PLACEMENT        Mardella LaymanHagler, Delecia Vastine, MD 07/28/17 [phone]

## 2017-10-29 ENCOUNTER — Encounter: Payer: Self-pay | Admitting: Cardiology

## 2017-11-03 ENCOUNTER — Encounter (HOSPITAL_COMMUNITY): Payer: Self-pay | Admitting: Emergency Medicine

## 2017-11-03 ENCOUNTER — Ambulatory Visit (HOSPITAL_COMMUNITY)
Admission: EM | Admit: 2017-11-03 | Discharge: 2017-11-03 | Disposition: A | Discharge status: Home / Self Care | Payer: BLUE CROSS/BLUE SHIELD | Attending: Family Medicine | Admitting: Family Medicine

## 2017-11-03 DIAGNOSIS — M109 Gout, unspecified: Secondary | ICD-10-CM

## 2017-11-03 DIAGNOSIS — M25562 Pain in left knee: Secondary | ICD-10-CM

## 2017-11-03 MED ORDER — COLCHICINE 0.6 MG PO TABS: ORAL_TABLET | ORAL | 0 refills | Status: DC

## 2017-11-03 MED ORDER — KETOROLAC TROMETHAMINE 60 MG/2ML IM SOLN
60.0000 mg | Freq: Once | INTRAMUSCULAR | Status: AC
  Administered 2017-11-03: 60 mg via INTRAMUSCULAR

## 2017-11-03 MED ORDER — INDOMETHACIN 50 MG PO CAPS: 50.0000 mg | ORAL_CAPSULE | Freq: Three times a day (TID) | ORAL | 0 refills | Status: DC

## 2017-11-03 MED ORDER — KETOROLAC TROMETHAMINE 60 MG/2ML IM SOLN
INTRAMUSCULAR | Status: AC
  Filled 2017-11-03: qty 2

## 2017-11-03 NOTE — ED Provider Notes (Signed)
[facility]   [id number] 11/03/17 Arrival Time: 1559  ASSESSMENT & PLAN:  1. Acute pain of left knee   2. Acute gout of left knee, unspecified cause    Meds ordered this encounter  Medications  . ketorolac (TORADOL) injection 60 mg  . colchicine 0.6 MG tablet    Sig: Take two tablets as one dose followed one hour later by one tablet. Max 3 tablets in 24 hours.    Dispense:  6 tablet    Refill:  0  . indomethacin (INDOCIN) 50 MG capsule    Sig: Take 1 capsule (50 mg total) by mouth 3 (three) times daily with meals.    Dispense:  15 capsule    Refill:  0   May f/u here as needed.  Reviewed expectations re: course of current medical issues. Questions answered. Outlined signs and symptoms indicating need for more acute intervention. Patient verbalized understanding. After Visit Summary given.  SUBJECTIVE: History from: patient. [name] Harrington is a 52 y.o. male who reports persistent moderate pain of his left knee; described as aching without radiation. Onset: gradual, 3-4 days ago. Injury/trama: no. Relieved by: nothing in particular. Worsened by: certain movements. Associated symptoms: none reported. Extremity sensation changes or weakness: none. Self treatment: has not tried OTCs for relief of pain. History of similar: yes, gout 'and it feels the same'  ROS: As per HPI.   OBJECTIVE:  Vitals:   11/03/17 1640  BP: 135/77  Pulse: 72  Resp: 18  Temp: 98 F (36.7 C)  TempSrc: Oral  SpO2: 100%    General appearance: alert; no distress Extremities: warm and well perfused; symmetrical with no gross deformities; diffuse tenderness over his left knee with very slight swelling and no bruising; ROM: normal CV: normal extremity capillary refill Skin: warm and dry Neurologic: normal gait; normal symmetric reflexes in all extremities; normal sensation in all extremities Psychological: alert and cooperative; normal mood and affect  Allergies  Allergen  Reactions  . Ace Inhibitors Shortness Of Breath    REACTION: angioedema - facial swelling and difficulty breathing    Past Medical History:  Diagnosis Date  . Alcohol abuse   . CHF (congestive heart failure) (HCC)    a. 09/2008 echo: EF 25%  . Gout   . LBBB (left bundle branch block)   . Nonischemic cardiomyopathy (HCC)    afib  . Pre-syncope   . Ventricular tachycardia (HCC)    a. cath 10/22/2008- NL Cors. EF 25% b. s/p St.  Jude current + DR dual chamber AICD 10/22/2008   Social History   Socioeconomic History  . Marital status: Single    Spouse name: Not on file  . Number of children: Not on file  . Years of education: Not on file  . Highest education level: Not on file  Occupational History  . Not on file  Social Needs  . Financial resource strain: Not on file  . Food insecurity:    Worry: Not on file    Inability: Not on file  . Transportation needs:    Medical: Not on file    Non-medical: Not on file  Tobacco Use  . Smoking status: Never Smoker  . Smokeless tobacco: Never Used  Substance and Sexual Activity  . Alcohol use: Yes    Comment: frequently  . Drug use: No  . Sexual activity: Yes  Lifestyle  . Physical activity:    Days per week: Not on file    Minutes per session: Not  on file  . Stress: Not on file  Relationships  . Social connections:    Talks on phone: Not on file    Gets together: Not on file    Attends religious service: Not on file    Active member of club or organization: Not on file    Attends meetings of clubs or organizations: Not on file    Relationship status: Not on file  . Intimate partner violence:    Fear of current or ex partner: Not on file    Emotionally abused: Not on file    Physically abused: Not on file    Forced sexual activity: Not on file  Other Topics Concern  . Not on file  Social History Narrative   Unemployed lives with fiance in [place]. Drinks alcohol frequently    Family History  Problem Relation Age  of Onset  . Breast cancer Mother 14       hx  . Graves' disease Sister    Past Surgical History:  Procedure Laterality Date  . CLOSED REDUCTION NASAL FRACTURE  11/13/10   w internal and external stabilization  . fragment lag screw fixation    . IMPLANTABLE CARDIOVERTER DEFIBRILLATOR IMPLANT    . PACEMAKER PLACEMENT        Mardella Layman, MD 11/15/17 [phone]

## 2017-11-03 NOTE — ED Triage Notes (Signed)
Pt sts left sided knee pain from gout

## 2017-11-17 ENCOUNTER — Ambulatory Visit (HOSPITAL_COMMUNITY)
Admission: EM | Admit: 2017-11-17 | Discharge: 2017-11-17 | Disposition: A | Discharge status: Home / Self Care | Payer: BLUE CROSS/BLUE SHIELD | Attending: Family Medicine | Admitting: Family Medicine

## 2017-11-17 ENCOUNTER — Encounter (HOSPITAL_COMMUNITY): Payer: Self-pay

## 2017-11-17 DIAGNOSIS — M25562 Pain in left knee: Secondary | ICD-10-CM | POA: Diagnosis not present

## 2017-11-17 MED ORDER — KETOROLAC TROMETHAMINE 30 MG/ML IJ SOLN
30.0000 mg | Freq: Once | INTRAMUSCULAR | Status: AC
  Administered 2017-11-17: 30 mg via INTRAMUSCULAR

## 2017-11-17 MED ORDER — INDOMETHACIN 50 MG PO CAPS: 50.0000 mg | ORAL_CAPSULE | Freq: Three times a day (TID) | ORAL | 0 refills | Status: DC

## 2017-11-17 MED ORDER — PREDNISONE 10 MG (21) PO TBPK: ORAL_TABLET | Freq: Every day | ORAL | 0 refills | Status: DC

## 2017-11-17 MED ORDER — KETOROLAC TROMETHAMINE 30 MG/ML IJ SOLN
INTRAMUSCULAR | Status: AC
  Filled 2017-11-17: qty 1

## 2017-11-17 NOTE — ED Triage Notes (Signed)
Pt presents with gout flare up 

## 2017-11-17 NOTE — ED Provider Notes (Signed)
[facility]   [id number] 11/17/17 Arrival Time: 1633  ASSESSMENT & PLAN:  1. Left knee pain, unspecified chronicity     Meds ordered this encounter  Medications  . ketorolac (TORADOL) 30 MG/ML injection 30 mg  . indomethacin (INDOCIN) 50 MG capsule    Sig: Take 1 capsule (50 mg total) by mouth 3 (three) times daily with meals.    Dispense:  15 capsule    Refill:  0  . predniSONE (STERAPRED UNI-PAK 21 TAB) 10 MG (21) TBPK tablet    Sig: Take by mouth daily. Take as directed.    Dispense:  21 tablet    Refill:  0    Natural history and expected course discussed. Questions answered. NSAIDs per medication orders.  If this is not improving I recommend orthopaedic evaluation.  Reviewed expectations re: course of current medical issues. Questions answered. Outlined signs and symptoms indicating need for more acute intervention. Patient verbalized understanding. After Visit Summary given.  SUBJECTIVE: History from: patient. [name] Harrington is a 52 y.o. male who reports intermittent moderate pain of his left knee that is gradually worsening; described as aching without radiation. Has attributed this to gout in the past but keeps recurring. Onset: gradual, over the past few days. Injury/trama: no. Relieved by: rest. Worsened by: certain movements. Associated symptoms: none reported. Extremity sensation changes or weakness: none. Self treatment: tried OTCs without relief of pain. History of similar: yes, several weeks ago and on/off over the past few months.  ROS: As per HPI.   OBJECTIVE:  Vitals:   11/17/17 1651  BP: 116/73  Pulse: 85  Resp: 20  Temp: 98.5 F (36.9 C)  TempSrc: Oral  SpO2: 100%    General appearance: alert; no distress Extremities: warm and well perfused; symmetrical with no gross deformities; diffuse and poorly localized tenderness over his left knee with mild swelling and no bruising; ROM: normal but with reported discomfort CV: normal  extremity capillary refill Skin: warm and dry Neurologic: normal gait; normal symmetric reflexes in all extremities; normal sensation in all extremities Psychological: alert and cooperative; normal mood and affect  Allergies  Allergen Reactions  . Ace Inhibitors Shortness Of Breath    REACTION: angioedema - facial swelling and difficulty breathing    Past Medical History:  Diagnosis Date  . Alcohol abuse   . CHF (congestive heart failure) (HCC)    a. 09/2008 echo: EF 25%  . Gout   . LBBB (left bundle branch block)   . Nonischemic cardiomyopathy (HCC)    afib  . Pre-syncope   . Ventricular tachycardia (HCC)    a. cath 10/22/2008- NL Cors. EF 25% b. s/p St.  Jude current + DR dual chamber AICD 10/22/2008   Social History   Socioeconomic History  . Marital status: Single    Spouse name: Not on file  . Number of children: Not on file  . Years of education: Not on file  . Highest education level: Not on file  Occupational History  . Not on file  Social Needs  . Financial resource strain: Not on file  . Food insecurity:    Worry: Not on file    Inability: Not on file  . Transportation needs:    Medical: Not on file    Non-medical: Not on file  Tobacco Use  . Smoking status: Never Smoker  . Smokeless tobacco: Never Used  Substance and Sexual Activity  . Alcohol use: Yes    Comment: frequently  . Drug use:  No  . Sexual activity: Yes  Lifestyle  . Physical activity:    Days per week: Not on file    Minutes per session: Not on file  . Stress: Not on file  Relationships  . Social connections:    Talks on phone: Not on file    Gets together: Not on file    Attends religious service: Not on file    Active member of club or organization: Not on file    Attends meetings of clubs or organizations: Not on file    Relationship status: Not on file  . Intimate partner violence:    Fear of current or ex partner: Not on file    Emotionally abused: Not on file    Physically  abused: Not on file    Forced sexual activity: Not on file  Other Topics Concern  . Not on file  Social History Narrative   Unemployed [place] in [place]. Drinks alcohol frequently    Family History  Problem Relation Age of Onset  . Breast cancer Mother 562       hx  . Graves' disease Sister    Past Surgical History:  Procedure Laterality Date  . CLOSED REDUCTION NASAL FRACTURE  11/13/10   w internal and external stabilization  . fragment lag screw fixation    . IMPLANTABLE CARDIOVERTER DEFIBRILLATOR IMPLANT    . PACEMAKER PLACEMENT        Mardella LaymanHagler, [name] Croston, MD 11/27/17 1005

## 2017-11-17 NOTE — Discharge Instructions (Signed)
If the medicines given to you today fail to bring relief, I would recommend following up with an orthopaedist.

## 2017-11-20 ENCOUNTER — Telehealth (HOSPITAL_COMMUNITY): Payer: Self-pay | Admitting: Emergency Medicine

## 2017-11-20 NOTE — Telephone Encounter (Signed)
Patient lost half his pills.  Spoke to [employer]gate-Palmolivenatalie, np about patient.  Agreed to call in 1/2 the prescribed quantity to pharmacy per natalie burkey, np.    Indomethacin 50 mg  One capsule by mouth 3 times a day.   #9, no refills. Jenetta LogesNatalie Burkey, NP?Stephanieann Popescu lapan-hutchens, rn  Explained to pharmacy patient lost pills.

## 2018-12-27 ENCOUNTER — Ambulatory Visit (INDEPENDENT_AMBULATORY_CARE_PROVIDER_SITE_OTHER): Payer: BLUE CROSS/BLUE SHIELD

## 2018-12-27 ENCOUNTER — Encounter (HOSPITAL_COMMUNITY): Payer: Self-pay

## 2018-12-27 ENCOUNTER — Ambulatory Visit (INDEPENDENT_AMBULATORY_CARE_PROVIDER_SITE_OTHER)
Admission: EM | Admit: 2018-12-27 | Discharge: 2018-12-27 | Disposition: A | Discharge status: Home / Self Care | Payer: BLUE CROSS/BLUE SHIELD | Source: Home / Self Care

## 2018-12-27 ENCOUNTER — Emergency Department (HOSPITAL_COMMUNITY)
Admission: EM | Admit: 2018-12-27 | Discharge: 2018-12-27 | Disposition: A | Discharge status: Home / Self Care | Payer: BLUE CROSS/BLUE SHIELD | Attending: Emergency Medicine | Admitting: Emergency Medicine

## 2018-12-27 ENCOUNTER — Other Ambulatory Visit: Payer: Self-pay

## 2018-12-27 DIAGNOSIS — I10 Essential (primary) hypertension: Secondary | ICD-10-CM | POA: Diagnosis not present

## 2018-12-27 DIAGNOSIS — Z950 Presence of cardiac pacemaker: Secondary | ICD-10-CM | POA: Insufficient documentation

## 2018-12-27 DIAGNOSIS — I509 Heart failure, unspecified: Secondary | ICD-10-CM

## 2018-12-27 DIAGNOSIS — Z20828 Contact with and (suspected) exposure to other viral communicable diseases: Secondary | ICD-10-CM | POA: Diagnosis not present

## 2018-12-27 DIAGNOSIS — Z79899 Other long term (current) drug therapy: Secondary | ICD-10-CM | POA: Insufficient documentation

## 2018-12-27 DIAGNOSIS — R0602 Shortness of breath: Secondary | ICD-10-CM | POA: Diagnosis present

## 2018-12-27 DIAGNOSIS — I5022 Chronic systolic (congestive) heart failure: Secondary | ICD-10-CM | POA: Diagnosis not present

## 2018-12-27 DIAGNOSIS — I110 Hypertensive heart disease with heart failure: Secondary | ICD-10-CM | POA: Diagnosis not present

## 2018-12-27 DIAGNOSIS — Z9114 Patient's other noncompliance with medication regimen: Secondary | ICD-10-CM | POA: Diagnosis not present

## 2018-12-27 LAB — CBC WITH DIFFERENTIAL/PLATELET
Abs Immature Granulocytes: 0.03 10*3/uL (ref 0.00–0.07)
Basophils Absolute: 0.1 10*3/uL (ref 0.0–0.1)
Basophils Relative: 1 %
Eosinophils Absolute: 0.2 10*3/uL (ref 0.0–0.5)
Eosinophils Relative: 3 %
HCT: 53.1 % — ABNORMAL HIGH (ref 39.0–52.0)
Hemoglobin: 17 g/dL (ref 13.0–17.0)
Immature Granulocytes: 1 %
Lymphocytes Relative: 22 %
Lymphs Abs: 1.5 10*3/uL (ref 0.7–4.0)
MCH: 29.7 pg (ref 26.0–34.0)
MCHC: 32 g/dL (ref 30.0–36.0)
MCV: 92.8 fL (ref 80.0–100.0)
Monocytes Absolute: 0.6 10*3/uL (ref 0.1–1.0)
Monocytes Relative: 10 %
Neutro Abs: 4.1 10*3/uL (ref 1.7–7.7)
Neutrophils Relative %: 63 %
Platelets: 198 10*3/uL (ref 150–400)
RBC: 5.72 MIL/uL (ref 4.22–5.81)
RDW: 15.2 % (ref 11.5–15.5)
WBC: 6.5 10*3/uL (ref 4.0–10.5)
nRBC: 0 % (ref 0.0–0.2)

## 2018-12-27 LAB — BASIC METABOLIC PANEL
Anion gap: 12 (ref 5–15)
BUN: 20 mg/dL (ref 6–20)
CO2: 22 mmol/L (ref 22–32)
Calcium: 8.9 mg/dL (ref 8.9–10.3)
Chloride: 101 mmol/L (ref 98–111)
Creatinine, Ser: 1.2 mg/dL (ref 0.61–1.24)
GFR calc Af Amer: >60 mL/min (ref >60)
GFR calc non Af Amer: >60 mL/min (ref >60)
Glucose, Bld: 112 mg/dL — ABNORMAL HIGH (ref 70–99)
Potassium: 4.9 mmol/L (ref 3.5–5.1)
Sodium: 135 mmol/L (ref 135–145)

## 2018-12-27 LAB — TROPONIN I (HIGH SENSITIVITY): Troponin I (High Sensitivity): 65 ng/L — ABNORMAL HIGH (ref <18)

## 2018-12-27 LAB — BRAIN NATRIURETIC PEPTIDE: B Natriuretic Peptide: 1633.6 pg/mL — ABNORMAL HIGH (ref 0.0–100.0)

## 2018-12-27 MED ORDER — CARVEDILOL 12.5 MG PO TABS
25.0000 mg | ORAL_TABLET | Freq: Two times a day (BID) | ORAL | Status: DC
  Administered 2018-12-27: 13:00:00 25 mg via ORAL
  Filled 2018-12-27: qty 2

## 2018-12-27 MED ORDER — FUROSEMIDE 40 MG PO TABS: 40.0000 mg | ORAL_TABLET | Freq: Every day | ORAL | 0 refills | Status: DC

## 2018-12-27 MED ORDER — FUROSEMIDE 10 MG/ML IJ SOLN
40.0000 mg | Freq: Once | INTRAMUSCULAR | Status: AC
  Administered 2018-12-27: 40 mg via INTRAVENOUS
  Filled 2018-12-27: qty 4

## 2018-12-27 NOTE — ED Notes (Signed)
Patient is being discharged from the [facility] and sent to the Emergency Department via wheelchair by staff. Per [place], [place], patient is stable but in need of higher level of care due to fluid in lungs found on his chest x-ray, shortness of breath with minimal exertion, and EKG changes. Patient is aware and verbalizes understanding of plan of care.  Vitals:   12/27/18 0906  BP: (!) 166/121  Pulse: (!) 111  Resp: (!) 30  Temp: 98.2 F (36.8 C)  SpO2: 97%

## 2018-12-27 NOTE — Discharge Instructions (Addendum)
It is important for you to take your Lasix as directed to help with the swelling in your legs and your shortness of breath. Please follow-up at the heart failure clinic as well as the cardiologist for further evaluation. Return to the ED if you start to experience chest pain, swelling of your leg on one side, increased shortness of breath, feeling like you are going to pass out.

## 2018-12-27 NOTE — ED Notes (Signed)
Patient verbalizes understanding of discharge instructions . Opportunity for questions and answers were provided . Armband removed by staff ,Pt discharged from ED. W/C  offered at D/C  and Declined W/C at D/C and was escorted to lobby by RN.  

## 2018-12-27 NOTE — ED Provider Notes (Signed)
[name] Harrington Provider Note   CSN: [id number] Arrival date & time: 12/27/18  1006     History   Chief Complaint Chief Complaint  Patient presents with   Shortness of Breath    HPI [name] Harrington is a 53 y.o. male with a past medical history of CHF with last EF of 10 to 15% on echo done in 2017, AICD in place presents to ED for evaluation of 1 month history of gradually worsening shortness of breath, bilateral lower extremity edema.  He states that he was given a diuretic to use as needed to help with swelling related to gout.  States that he has been compliant with his home antihypertensive but does not take the diuretic anymore.  He has not seen his cardiologist in the past 3 years.  He denies any chest pain but does endorse shortness of breath with exertion and walking short distances.  He denies wearing supplemental oxygen at home, history of DVT, PE or MI, fever, cough, sick contacts or similar symptoms, abdominal pain, vomiting.     HPI  Past Medical History:  Diagnosis Date   Alcohol abuse    CHF (congestive heart failure) ([place])    a. 09/2008 echo: EF 25%   Gout    LBBB (left bundle branch block)    Nonischemic cardiomyopathy ([place])    afib   Pre-syncope    Ventricular tachycardia ([place])    a. cath 10/22/2008- NL Cors. EF 25% b. s/p St.  Jude current + DR dual chamber AICD 10/22/2008    Patient Active Problem List   Diagnosis Date Noted   Other and unspecified hyperlipidemia 12/15/2010   ESSENTIAL HYPERTENSION, BENIGN 03/01/2009   ATRIAL FIBRILLATION 03/01/2009   GOUT 11/01/2008   CARDIOMYOPATHY, DILATED 11/01/2008   VENTRICULAR TACHYCARDIA 27/09/2374   CHRONIC SYSTOLIC HEART FAILURE 28/31/5176   Alcohol abuse 10/31/2008    Past Surgical History:  Procedure Laterality Date   CLOSED REDUCTION NASAL FRACTURE  11/13/10   w internal and external stabilization   fragment lag screw fixation     IMPLANTABLE  CARDIOVERTER DEFIBRILLATOR IMPLANT     PACEMAKER PLACEMENT          Home Medications    Prior to Admission medications   Medication Sig Start Date End Date Taking? Authorizing Provider  allopurinol (ZYLOPRIM) 300 MG tablet Take 1 tablet (300 mg total) by mouth daily. 11/06/16   Enrique Sack, FNP  carvedilol (COREG) 25 MG tablet Take 1 tablet (25 mg total) by mouth 2 (two) times daily with a meal. 06/01/17   Tereasa Coop, PA-C  colchicine 0.6 MG tablet Take two tablets as one dose followed one hour later by one tablet. Max 3 tablets in 24 hours. 11/03/17   Vanessa Kick, MD  furosemide (LASIX) 40 MG tablet Take 1 tablet (40 mg total) by mouth daily. 12/27/18   George Alcantar, PA-C  hydrALAZINE (APRESOLINE) 50 MG tablet Take 1 tablet (50 mg total) by mouth 3 (three) times daily. 04/29/16 06/01/17  Clegg, Amy D, NP  indomethacin (INDOCIN) 50 MG capsule Take 1 capsule (50 mg total) by mouth 3 (three) times daily with meals. 11/17/17   Vanessa Kick, MD  isosorbide mononitrate (IMDUR) 30 MG 24 hr tablet Take 1 tablet (30 mg total) by mouth daily. 04/29/16 11/06/16  Clegg, Amy D, NP  omeprazole (PRILOSEC) 20 MG capsule Take 20 mg by mouth daily.      [provider]  predniSONE (STERAPRED UNI-PAK 21 TAB)  10 MG (21) TBPK tablet Take by mouth daily. Take as directed. 11/17/17   Mardella LaymanHagler, Brian, MD  spironolactone (ALDACTONE) 25 MG tablet Take 0.5 tablets (12.5 mg total) by mouth daily. 04/29/16 06/01/17  Sherald Hesslegg, Amy D, NP    Family History Family History  Problem Relation Age of Onset   Breast cancer Mother 8062       hx   Graves' disease Sister     Social History Social History   Tobacco Use   Smoking status: Never Smoker   Smokeless tobacco: Never Used  Substance Use Topics   Alcohol use: Yes    Comment: frequently   Drug use: No     Allergies   Ace inhibitors   Review of Systems Review of Systems  Constitutional: Negative for appetite change, chills and fever.  HENT:  Negative for ear pain, rhinorrhea, sneezing and sore throat.   Eyes: Negative for photophobia and visual disturbance.  Respiratory: Positive for shortness of breath. Negative for cough, chest tightness and wheezing.   Cardiovascular: Positive for leg swelling. Negative for chest pain and palpitations.  Gastrointestinal: Negative for abdominal pain, blood in stool, constipation, diarrhea, nausea and vomiting.  Genitourinary: Negative for dysuria, hematuria and urgency.  Musculoskeletal: Negative for myalgias.  Skin: Negative for rash.  Neurological: Negative for dizziness, weakness and light-headedness.     Physical Exam Updated Vital Signs BP 119/87    Pulse (!) 57    Temp 97.9 F (36.6 C) (Oral)    Resp 19    SpO2 100%   Physical Exam Vitals signs and nursing note reviewed.  Constitutional:      General: He is not in acute distress.    Appearance: He is well-developed. He is obese.  HENT:     Head: Normocephalic and atraumatic.     Nose: Nose normal.  Eyes:     General: No scleral icterus.       Left eye: No discharge.     Conjunctiva/sclera: Conjunctivae normal.  Neck:     Musculoskeletal: Normal range of motion and neck supple.  Cardiovascular:     Rate and Rhythm: Regular rhythm. Tachycardia present.     Heart sounds: Normal heart sounds. No murmur. No friction rub. No gallop.   Pulmonary:     Effort: Pulmonary effort is normal. No respiratory distress.     Breath sounds: Normal breath sounds.  Abdominal:     General: Bowel sounds are normal. There is no distension.     Palpations: Abdomen is soft.     Tenderness: There is no abdominal tenderness. There is no guarding.  Musculoskeletal: Normal range of motion.     Right lower leg: Edema present.     Left lower leg: Edema present.     Comments: 3+ pitting edema bilateral lower extremities.  No calf tenderness, erythema or warmth of extremity noted.  Skin:    General: Skin is warm and dry.     Findings: No rash.    Neurological:     Mental Status: He is alert.     Motor: No abnormal muscle tone.     Coordination: Coordination normal.      ED Treatments / Results  Labs (all labs ordered are listed, but only abnormal results are displayed) Labs Reviewed  BASIC METABOLIC PANEL - Abnormal; Notable for the following components:      Result Value   Glucose, Bld 112 (*)    All other components within normal limits  CBC WITH DIFFERENTIAL/PLATELET -  Abnormal; Notable for the following components:   HCT 53.1 (*)    All other components within normal limits  BRAIN NATRIURETIC PEPTIDE - Abnormal; Notable for the following components:   B Natriuretic Peptide 1,633.6 (*)    All other components within normal limits  TROPONIN I (HIGH SENSITIVITY) - Abnormal; Notable for the following components:   Troponin I (High Sensitivity) 65 (*)    All other components within normal limits    EKG None  Radiology Dg Chest 2 View  Result Date: 12/27/2018 CLINICAL DATA:  Shortness of breath for a month. History of high blood pressure and irregular heartbeat. EXAM: CHEST - 2 VIEW COMPARISON:  Chest x-ray dated 10/23/2008. FINDINGS: Marked cardiomegaly, increased compared to the previous study. LEFT chest wall pacemaker/ICD apparatus in place. Mildly prominent interstitial markings bilaterally, presumably edema. No evidence of alveolar pulmonary edema. No confluent opacity to suggest a consolidating pneumonia. No pleural effusion or pneumothorax. No acute appearing osseous abnormality. IMPRESSION: 1. Marked cardiomegaly, increased compared to the previous study of 10/23/2008. 2. Mildly prominent interstitial markings bilaterally, presumably interstitial edema related to mild CHF/volume overload. Electronically Signed   By: Bary RichardStan  Maynard M.D.   On: 12/27/2018 09:45    Procedures Procedures (including critical care time)  Medications Ordered in ED Medications  carvedilol (COREG) tablet 25 mg (25 mg Oral Given 12/27/18  1232)  furosemide (LASIX) injection 40 mg (40 mg Intravenous Given 12/27/18 1436)     Initial Impression / Assessment and Plan / ED Course  I have reviewed the triage vital signs and the nursing notes.  Pertinent labs & imaging results that were available during my care of the patient were reviewed by me and considered in my medical decision making (see chart for details).  Clinical Course as of Dec 26 1452  Tue Dec 27, 2018  1055 2 view chest x-ray done at urgent care prior to arrival IMPRESSION: 1. Marked cardiomegaly, increased compared to the previous study of 10/23/2008. 2. Mildly prominent interstitial markings bilaterally, presumably interstitial edema related to mild CHF/volume overload.   [HK]    Clinical Course User Index [HK] Dietrich PatesKhatri, Mace Weinberg, [place]-C       [id number] M presents to ED for shortness of breath, leg swelling, for the past month.  Patient has a known history of CHF with an EF of 10 to 15% seen on echo 3 years ago.  However he is noncompliant, does not have a PCP and has not followed up with his cardiologist in the past 3 years.  States that his symptoms have gradually worsened over the past month.  He was given as needed Lasix in the past to help with swelling associated with gout flareups.  On exam patient with with pitting edema in bilateral lower extremities symmetrically.  No calf tenderness noted. He does not require supplemental oxygen. Speaking in complete sentences.  Lab work significant for BNP greater than 1500, troponin of 65 which could be due to his CHF.  Chest x-ray with signs consistent with mild CHF exacerbation.  I had a long discussion with the patient regarding compliance with his medications and following up with cardiology.  He states that he does not feel that he needs admission at this time due to his symptoms.  He was given IV Lasix here and will be restarted back on his p.o. Lasix at home. Patient understands benefits of admission but continues to decline.   We will have him follow-up at the heart failure clinic, cardiologist and continue Lasix.  Will return for any worsening symptoms.  Patient is hemodynamically stable, in NAD, and able to ambulate in the ED. Evaluation does not show pathology that would require ongoing emergent intervention or inpatient treatment. I explained the diagnosis to the patient. Pain has been managed and has no complaints prior to discharge. Patient is comfortable with above plan and is stable for discharge at this time. All questions were answered prior to disposition. Strict return precautions for returning to the ED were discussed. Encouraged follow up with PCP.   An After Visit Summary was printed and given to the patient.   Portions of this note were generated with Scientist, clinical (histocompatibility and immunogenetics). Dictation errors may occur despite best attempts at proofreading.   Final Clinical Impressions(s) / ED Diagnoses   Final diagnoses:  Acute on chronic congestive heart failure, unspecified heart failure type [facility])    ED Discharge Orders         Ordered    furosemide (LASIX) 40 MG tablet  Daily     12/27/18 1451           Dietrich Pates, PA-C 12/27/18 1454    Milagros Loll, MD 12/28/18 [phone]

## 2018-12-27 NOTE — ED Provider Notes (Signed)
[place]    CSN: [id number] Arrival date & time: 12/27/18  0854      History   Chief Complaint Chief Complaint  Patient presents with  . Shortness of Breath    HPI [name] Harrington is a 53 y.o. male.   The history is provided by the patient. No language interpreter was used.  Shortness of Breath Severity:  Moderate Onset quality:  Gradual Duration:  5 days Timing:  Constant Progression:  Worsening Chronicity:  New Context: URI   Relieved by:  Nothing Worsened by:  Nothing Ineffective treatments:  None tried Associated symptoms: cough and diaphoresis   Risk factors: alcohol use     Past Medical History:  Diagnosis Date  . Alcohol abuse   . CHF (congestive heart failure) ([place])    a. 09/2008 echo: EF 25%  . Gout   . LBBB (left bundle branch block)   . Nonischemic cardiomyopathy ([facility])    afib  . Pre-syncope   . Ventricular tachycardia ([place])    a. cath 10/22/2008- NL Cors. EF 25% b. s/p St.  Jude current + DR dual chamber AICD 10/22/2008    Patient Active Problem List   Diagnosis Date Noted  . Other and unspecified hyperlipidemia 12/15/2010  . ESSENTIAL HYPERTENSION, BENIGN 03/01/2009  . ATRIAL FIBRILLATION 03/01/2009  . GOUT 11/01/2008  . CARDIOMYOPATHY, DILATED 11/01/2008  . VENTRICULAR TACHYCARDIA 11/01/2008  . CHRONIC SYSTOLIC HEART FAILURE 76/73/4193  . Alcohol abuse 10/31/2008    Past Surgical History:  Procedure Laterality Date  . CLOSED REDUCTION NASAL FRACTURE  11/13/10   w internal and external stabilization  . fragment lag screw fixation    . IMPLANTABLE CARDIOVERTER DEFIBRILLATOR IMPLANT    . PACEMAKER PLACEMENT         Home Medications    Prior to Admission medications   Medication Sig Start Date End Date Taking? Authorizing Provider  allopurinol (ZYLOPRIM) 300 MG tablet Take 1 tablet (300 mg total) by mouth daily. 11/06/16   Enrique Sack, FNP  carvedilol (COREG) 25 MG tablet Take 1 tablet (25 mg total) by mouth 2 (two)  times daily with a meal. 06/01/17   Tereasa Coop, PA-C  colchicine 0.6 MG tablet Take two tablets as one dose followed one hour later by one tablet. Max 3 tablets in 24 hours. 11/03/17   Vanessa Kick, MD  furosemide (LASIX) 40 MG tablet Take 40 mg by mouth daily as needed (swelling).  04/11/12   Bensimhon, Shaune Pascal, MD  hydrALAZINE (APRESOLINE) 50 MG tablet Take 1 tablet (50 mg total) by mouth 3 (three) times daily. 04/29/16 06/01/17  Clegg, Amy D, NP  indomethacin (INDOCIN) 50 MG capsule Take 1 capsule (50 mg total) by mouth 3 (three) times daily with meals. 11/17/17   Vanessa Kick, MD  isosorbide mononitrate (IMDUR) 30 MG 24 hr tablet Take 1 tablet (30 mg total) by mouth daily. 04/29/16 11/06/16  Clegg, Amy D, NP  omeprazole (PRILOSEC) 20 MG capsule Take 20 mg by mouth daily.      [provider]  predniSONE (STERAPRED UNI-PAK 21 TAB) 10 MG (21) TBPK tablet Take by mouth daily. Take as directed. 11/17/17   Vanessa Kick, MD  spironolactone (ALDACTONE) 25 MG tablet Take 0.5 tablets (12.5 mg total) by mouth daily. 04/29/16 06/01/17  Conrad Tolani [place], NP    Family History Family History  Problem Relation Age of Onset  . Breast cancer Mother 49       hx  . Graves' disease Sister  Social History Social History   Tobacco Use  . Smoking status: Never Smoker  . Smokeless tobacco: Never Used  Substance Use Topics  . Alcohol use: Yes    Comment: frequently  . Drug use: No     Allergies   Ace inhibitors   Review of Systems Review of Systems  Constitutional: Positive for diaphoresis.  Respiratory: Positive for cough and shortness of breath.   All other systems reviewed and are negative.    Physical Exam Triage Vital Signs ED Triage Vitals  Enc Vitals Group     BP 12/27/18 0906 (!) 166/121     Pulse Rate 12/27/18 0906 (!) 111     Resp 12/27/18 0906 (!) 30     Temp 12/27/18 0906 98.2 F (36.8 C)     Temp Source 12/27/18 0906 Oral     SpO2 12/27/18 0906 97 %     Weight --       Height --      Head Circumference --      Peak Flow --      Pain Score 12/27/18 0911 0     Pain Loc --      Pain Edu? --      Excl. in GC? --    No data found.  Updated Vital Signs BP (!) 166/121 (BP Location: Right Arm)   Pulse (!) 111   Temp 98.2 F (36.8 C) (Oral)   Resp (!) 30   SpO2 97%   Visual Acuity Right Eye Distance:   Left Eye Distance:   Bilateral Distance:    Right Eye Near:   Left Eye Near:    Bilateral Near:     Physical Exam Vitals signs and nursing note reviewed.  Constitutional:      Appearance: He is well-developed.  HENT:     Head: Normocephalic and atraumatic.  Eyes:     Conjunctiva/sclera: Conjunctivae normal.  Neck:     Musculoskeletal: Neck supple.  Cardiovascular:     Rate and Rhythm: Normal rate and regular rhythm.     Heart sounds: No murmur.  Pulmonary:     Effort: Pulmonary effort is normal. No respiratory distress.     Breath sounds: Normal breath sounds. No decreased breath sounds.  Abdominal:     Palpations: Abdomen is soft.     Tenderness: There is no abdominal tenderness.  Musculoskeletal: Normal range of motion.  Skin:    General: Skin is warm and dry.  Neurological:     General: No focal deficit present.     Mental Status: He is alert.      UC Treatments / Results  Labs (all labs ordered are listed, but only abnormal results are displayed) Labs Reviewed  NOVEL CORONAVIRUS, NAA (HOSP ORDER, SEND-OUT TO REF LAB; TAT 18-24 HRS)    EKG   Radiology No results found.  Procedures Procedures (including critical care time)  Medications Ordered in UC Medications - No data to display  Initial Impression / Assessment and Plan / UC Course  I have reviewed the triage vital signs and the nursing notes.  Pertinent labs & imaging results that were available during my care of the patient were reviewed by me and considered in my medical decision making (see chart for details).     Chest xray shows chf.  Ekg  tachycardic.  Pt to ED for evaluation  Final Clinical Impressions(s) / UC Diagnoses   Final diagnoses:  Congestive heart failure, unspecified HF chronicity, unspecified [facility]   Discharge Instructions   None    ED Prescriptions    None     Controlled Substance Prescriptions [place] Controlled Substance Registry consulted? Not Applicable   Elson AreasSofia,  K, [place]-C 12/27/18 [phone]

## 2018-12-27 NOTE — ED Triage Notes (Signed)
Patient complains of increasing SOB x 1 month. Sent from [facility] for further evaluation. Patient here with ongoing ankle swelling and weight gain. Denies pain, non-smoking. No hx of CHF

## 2018-12-27 NOTE — ED Triage Notes (Signed)
Pt presents with shortness of breath since Friday.  Pt is diaphoretic upon exertion

## 2018-12-28 LAB — NOVEL CORONAVIRUS, NAA (HOSP ORDER, SEND-OUT TO REF LAB; TAT 18-24 HRS): SARS-CoV-2, NAA: NOT DETECTED

## 2019-01-06 ENCOUNTER — Ambulatory Visit (INDEPENDENT_AMBULATORY_CARE_PROVIDER_SITE_OTHER): Payer: BLUE CROSS/BLUE SHIELD | Admitting: Student

## 2019-01-06 ENCOUNTER — Other Ambulatory Visit: Payer: Self-pay

## 2019-01-06 VITALS — BP 130/90 | HR 77 | Ht 71.0 in | Wt 216.0 lb

## 2019-01-06 DIAGNOSIS — I5022 Chronic systolic (congestive) heart failure: Secondary | ICD-10-CM | POA: Diagnosis not present

## 2019-01-06 DIAGNOSIS — I472 Ventricular tachycardia: Secondary | ICD-10-CM

## 2019-01-06 DIAGNOSIS — I4729 Other ventricular tachycardia: Secondary | ICD-10-CM

## 2019-01-06 DIAGNOSIS — I10 Essential (primary) hypertension: Secondary | ICD-10-CM | POA: Diagnosis not present

## 2019-01-06 DIAGNOSIS — F1010 Alcohol abuse, uncomplicated: Secondary | ICD-10-CM

## 2019-01-06 MED ORDER — FUROSEMIDE 40 MG PO TABS: 40.0000 mg | ORAL_TABLET | Freq: Every day | ORAL | 6 refills | Status: AC

## 2019-01-06 MED ORDER — HYDRALAZINE HCL 25 MG PO TABS: 25.0000 mg | ORAL_TABLET | Freq: Three times a day (TID) | ORAL | 3 refills | Status: AC

## 2019-01-06 NOTE — Patient Instructions (Signed)
Medication Instructions:   START TAKING HYDRALAZINE 25 MG THREE TIMES A DAY   If you need a refill on your cardiac medications before your next appointment, please call your pharmacy.   Lab work: NONE ORDERED  TODAY  If you have labs (blood work) drawn today and your tests are completely normal, you will receive your results only by: Marland Kitchen MyChart Message (if you have MyChart) OR . A paper copy in the mail If you have any lab test that is abnormal or we need to change your treatment, we will call you to review the results.  Testing/Procedures: Your physician has requested that you have an echocardiogram. Echocardiography is a painless test that uses sound waves to create images of your heart. It provides your doctor with information about the size and shape of your heart and how well your heart's chambers and valves are working. This procedure takes approximately one hour. There are no restrictions for this procedure.   Follow-Up:   HEART FAILURE CLINIC ASAP  At [facility], you and your health needs are our priority.  As part of our continuing mission to provide you with exceptional heart care, we have created designated Provider Care Teams.  These Care Teams include your primary Cardiologist (physician) and Advanced Practice Providers (APPs -  Physician Assistants and Nurse Practitioners) who all work together to provide you with the care you need, when you need it. You will need a follow up appointment in 3-4  weeks. You may see Dr. Rayann Heman  or one of the following Advanced Practice Providers on your designated Care Team:   Chanetta Marshall, NP . Tommye Standard, PA-C . Donavan Burnet PA-C  Any Other Special Instructions Will Be Listed Below (If Applicable).

## 2019-01-06 NOTE — Progress Notes (Signed)
Electrophysiology Office Note Date: 01/06/2019  ID:  [name] Harrington, DOB 07-15-1965, MRN [id number]  PCP: [name] Harrington, [name] Harrington Primary Cardiologist: Dr. Haroldine Laws  Electrophysiologist: [name] Harrington  CC: Routine ICD follow-up  [name] Harrington is a 53 y.o. male seen today for [name] Harrington. Last seen by [name] Harrington 01/2016 to discuss ICD ERI. Pt refused gen change, and has not followed with EP since. Last seen by HF clinic 04/2016. He presents today for post hospital follow up.  Most recently admitted 05/01/4006 with A/C systolic CHF in the setting of non-compliance. Given IV lasix and resumed on po lasix.   Today, [name] Harrington is feeling OK. He has had gradually worsening DOE over the past 2-3 months, culminating in his EF visit 9/1. COVID NEGATIVE. He remains SOB with moderate exertion, like walking up a hill. He denies orthopnea or PND. Minimal SOB at times with ADLs. [name] chest pain.  He had a near-syncopal episode approx 3 months ago out working in the heat, in his job as a Development worker, international aid.  He denies any palpitations or tachy-palpitations leading up to it.  He denies nausea, vomiting, dizziness, syncope, or early satiety.   Device History: NOT ACTIVE ->St. Jude Single Chamber ICD implanted 2010 for chronic systolic CHF. Pt ERI as of 01/2016 and elected not to replace. History of appropriate therapy: [name] History of AAD therapy: [name]   Past Medical History:  Diagnosis Date  . Alcohol abuse   . CHF (congestive heart failure) ([place])    a. 09/2008 echo: EF 25%  . Gout   . LBBB (left bundle branch block)   . Nonischemic cardiomyopathy ([facility])    afib  . Pre-syncope   . Ventricular tachycardia ([place])    a. cath 10/22/2008- NL Cors. EF 25% b. s/p St.  Jude current + DR dual chamber AICD 10/22/2008   Past Surgical History:  Procedure Laterality Date  . CLOSED REDUCTION NASAL FRACTURE  11/13/10   w internal and external stabilization  . fragment lag screw fixation    . IMPLANTABLE CARDIOVERTER DEFIBRILLATOR  IMPLANT    . PACEMAKER PLACEMENT      Current Outpatient Medications  Medication Sig Dispense Refill  . allopurinol (ZYLOPRIM) 300 MG tablet Take 1 tablet (300 mg total) by mouth daily. 30 tablet 0  . carvedilol (COREG) 25 MG tablet Take 1 tablet (25 mg total) by mouth 2 (two) times daily with a meal. 60 tablet 0  . furosemide (LASIX) 40 MG tablet Take 1 tablet (40 mg total) by mouth daily. May take extra tablet  for swelling as needed 40 tablet 6  . isosorbide mononitrate (IMDUR) 30 MG 24 hr tablet Take 1 tablet (30 mg total) by mouth daily. 90 tablet 3  . omeprazole (PRILOSEC) 20 MG capsule Take 20 mg by mouth daily.      . hydrALAZINE (APRESOLINE) 25 MG tablet Take 1 tablet (25 mg total) by mouth 3 (three) times daily. 270 tablet 3   [name] current facility-administered medications for this visit.     Allergies:   Ace inhibitors   Social History: Social History   Socioeconomic History  . Marital status: Single    Spouse name: Not on file  . Number of children: Not on file  . Years of education: Not on file  . Highest education level: Not on file  Occupational History  . Not on file  Social Needs  . Financial resource strain: Not on file  . Food insecurity    Worry: Not on  file    Inability: Not on file  . Transportation needs    Medical: Not on file    Non-medical: Not on file  Tobacco Use  . Smoking status: Never Smoker  . Smokeless tobacco: Never Used  Substance and Sexual Activity  . Alcohol use: Yes    Comment: frequently  . Drug use: [name]  . Sexual activity: Yes  Lifestyle  . Physical activity    Days Harrington week: Not on file    Minutes Harrington session: Not on file  . Stress: Not on file  Relationships  . Social Musicianconnections    Talks on phone: Not on file    Gets together: Not on file    Attends religious service: Not on file    Active member of club or organization: Not on file    Attends meetings of clubs or organizations: Not on file    Relationship status: Not  on file  . Intimate partner violence    Fear of current or ex partner: Not on file    Emotionally abused: Not on file    Physically abused: Not on file    Forced sexual activity: Not on file  Other Topics Concern  . Not on file  Social History Narrative   Unemployed [place] Drinks alcohol frequently    Family History: Family History  Problem Relation Age of Onset  . Breast cancer Mother [id number]       hx  . Graves' disease Sister    Review of Systems: All other systems reviewed and are otherwise negative except as noted above.  Physical Exam: Vitals:   01/06/19 0836  BP: 130/90  Pulse: 77  Weight: 216 lb (98 kg)  Height: 5\' 11"  (1.803 m)    GEN- The [name] Harrington is well appearing, alert and oriented x 3 today.   HEENT: normocephalic, atraumatic; sclera clear, conjunctiva pink; hearing intact; oropharynx clear; neck supple, JVP at least 8-9 cm. Lymph- [name] cervical lymphadenopathy Lungs- Clear to ausculation bilaterally, normal work of breathing.  [name] wheezes, rales, rhonchi Heart- Regular rate and rhythm, [name] murmurs, rubs or gallops, PMI not laterally displaced GI- soft, non-tender, non-distended, bowel sounds present, [name] hepatosplenomegaly Extremities- [name] clubbing or cyanosis. DP/PT/radial pulses 2+ bilaterally. 1-2+ edema half way to his knees. His lower limbs are slightly cool, but not cold.   MS- [name] significant deformity or atrophy Skin- warm and dry, [name] rash or lesion; ICD pocket well healed Psych- euthymic mood, full affect Neuro- strength and sensation are intact  ICD interrogation- reviewed in detail today,  See PACEART report  EKG:  EKG is not ordered today. The ekg from  12/27/2018 personally reviewed shows sinus tach at 100 bpm with known LBBB and QRS of 191 ms.   Recent Labs: 12/27/2018: B Natriuretic Peptide 1,633.6; BUN 20; Creatinine, Ser 1.20; Hemoglobin 17.0; Platelets 198; Potassium 4.9; Sodium 135   Wt Readings from Last 3 Encounters:   01/06/19 216 lb (98 kg)  06/01/17 210 lb (95.3 kg)  04/29/16 224 lb 12.8 oz (102 kg)     Other studies Reviewed: Additional studies/ records that were reviewed today include: previous office notes,   Echo 01/2016 LVEF 10-15%, Grade 2 DD, Dyssynchrony, Mild/Mod AI, Mod/Sev LAE. Normal RV.  CPX 06/2011 showed exercise testing with gas exchange demonstrates a mildly reduced functional capacity when compared to matched sedentary norms during this apparent submaximal exercise test. Atpeak exercise [name] Harrington is limited by his ventilation which may be related to his  body habitus. There is [name] evidence of a circulatory limitation.   Assessment and Plan:  1.  Chronic systolic dysfunction - NICM Most recent echo 02/2016 with LVEF 10-15%.  NYHA III symptoms. QRS 194 -> CRT-D candidate Volume status mild to moderately elevated on exam Take lasix 80 mg x 2 days, then continue lasix 40 mg daily. Can repeat as needed.  Continue coreg 25 mg BID Continue imdur 30 mg daily. Restart hydralazine 25 mg TID.  [name] spiro for now with borderline high K (4.9) and non-compliance.  [name] ACE/ARNi with h/o angioedema. Reinforced fluid restriction to < 2 L daily, sodium restriction to less than 2000 mg daily, and the importance of daily weights.   May benefit from repeat CPX test (06/2011 with mildly reduced functional capacity and [name] evidence of circulatory limitation)  2. ICD Pt device ERI 01/2016. Pocket OK.  Pt would benefit from gen change, and specifically CRT-D upgrade. He is open to this. Discussed risks and benefits today. Will re-start his medicine, make follow up with HF clinic, and have him come back to see Dr. Johney Frame to discuss in 3-4 weeks.   3. HTN Will adjust medications in the setting of treating his CHF  4. H/o VT Near syncope ~ 3 months ago, but [name] frank syncope Harrington [name] Harrington.  Recommended Gen change today.   Pt would benefit from close and frequent HF follow up. He may eventually need VAD  consideration, but may improve with CRT-D therapy. I worry his marked non-compliance will continue to interfere with his treatment.   Reviewed ER prompts to [name] Harrington today including SOB at rest, chest pain, or syncope with or without tachypalpitations.   Current medicines are reviewed at length with the [name] Harrington today.   The [name] Harrington does not have concerns regarding his medicines.  The following changes were made today:  Restarted hydralazine at 25 mg BID.   Labs/ tests ordered today include:  Orders Placed This Encounter  Procedures  . ECHOCARDIOGRAM COMPLETE   Disposition:   Follow up with Dr. Johney Frame in 3-4 weeks to discuss possible gen change/CRT upgrade.   Dustin Flock, PA-C  01/06/2019 9:03 AM  [facility] [address] [place] [address] [phone] (office) [phone] (fax)   Greater than 50% of the 25 minute visit was spent in counseling/coordination of care regarding disease state education, salt/fluid restriction, sliding scale diuretics, and medication compliance.

## 2019-01-11 ENCOUNTER — Other Ambulatory Visit (HOSPITAL_COMMUNITY): Payer: BLUE CROSS/BLUE SHIELD

## 2019-01-19 ENCOUNTER — Encounter (HOSPITAL_COMMUNITY): Payer: Self-pay | Admitting: Student

## 2019-01-23 ENCOUNTER — Encounter (HOSPITAL_COMMUNITY): Payer: BLUE CROSS/BLUE SHIELD

## 2019-01-26 DEATH — deceased

## 2019-01-27 ENCOUNTER — Encounter: Payer: BLUE CROSS/BLUE SHIELD | Admitting: Student

## 2019-01-30 ENCOUNTER — Telehealth (HOSPITAL_COMMUNITY): Payer: Self-pay

## 2019-01-30 NOTE — Telephone Encounter (Signed)
New message    Just an FYI. We have made several attempts to contact this patient including sending a letter to schedule or reschedule their echocardiogram. We will be removing the patient from the echo WQ.   10.1.20 @ 11:15am phone just rings - unable to leave a vm - Romesha Scherer   9.24.20 @ 11:59am phone just rings - mail reminder letter Ivette Castronova  9.16.20 no show

## 2019-04-23 ENCOUNTER — Encounter: Payer: Self-pay | Admitting: Cardiology

## 2020-09-17 IMAGING — DX DG CHEST 2V
2 series · 2 of 2 positions shown · non-contrast
Comparison: Chest x-ray dated 10/23/2008.

CLINICAL DATA: Shortness of breath for a month. History of high
blood pressure and irregular heartbeat.

EXAM:
CHEST - 2 VIEW

[chest pa]
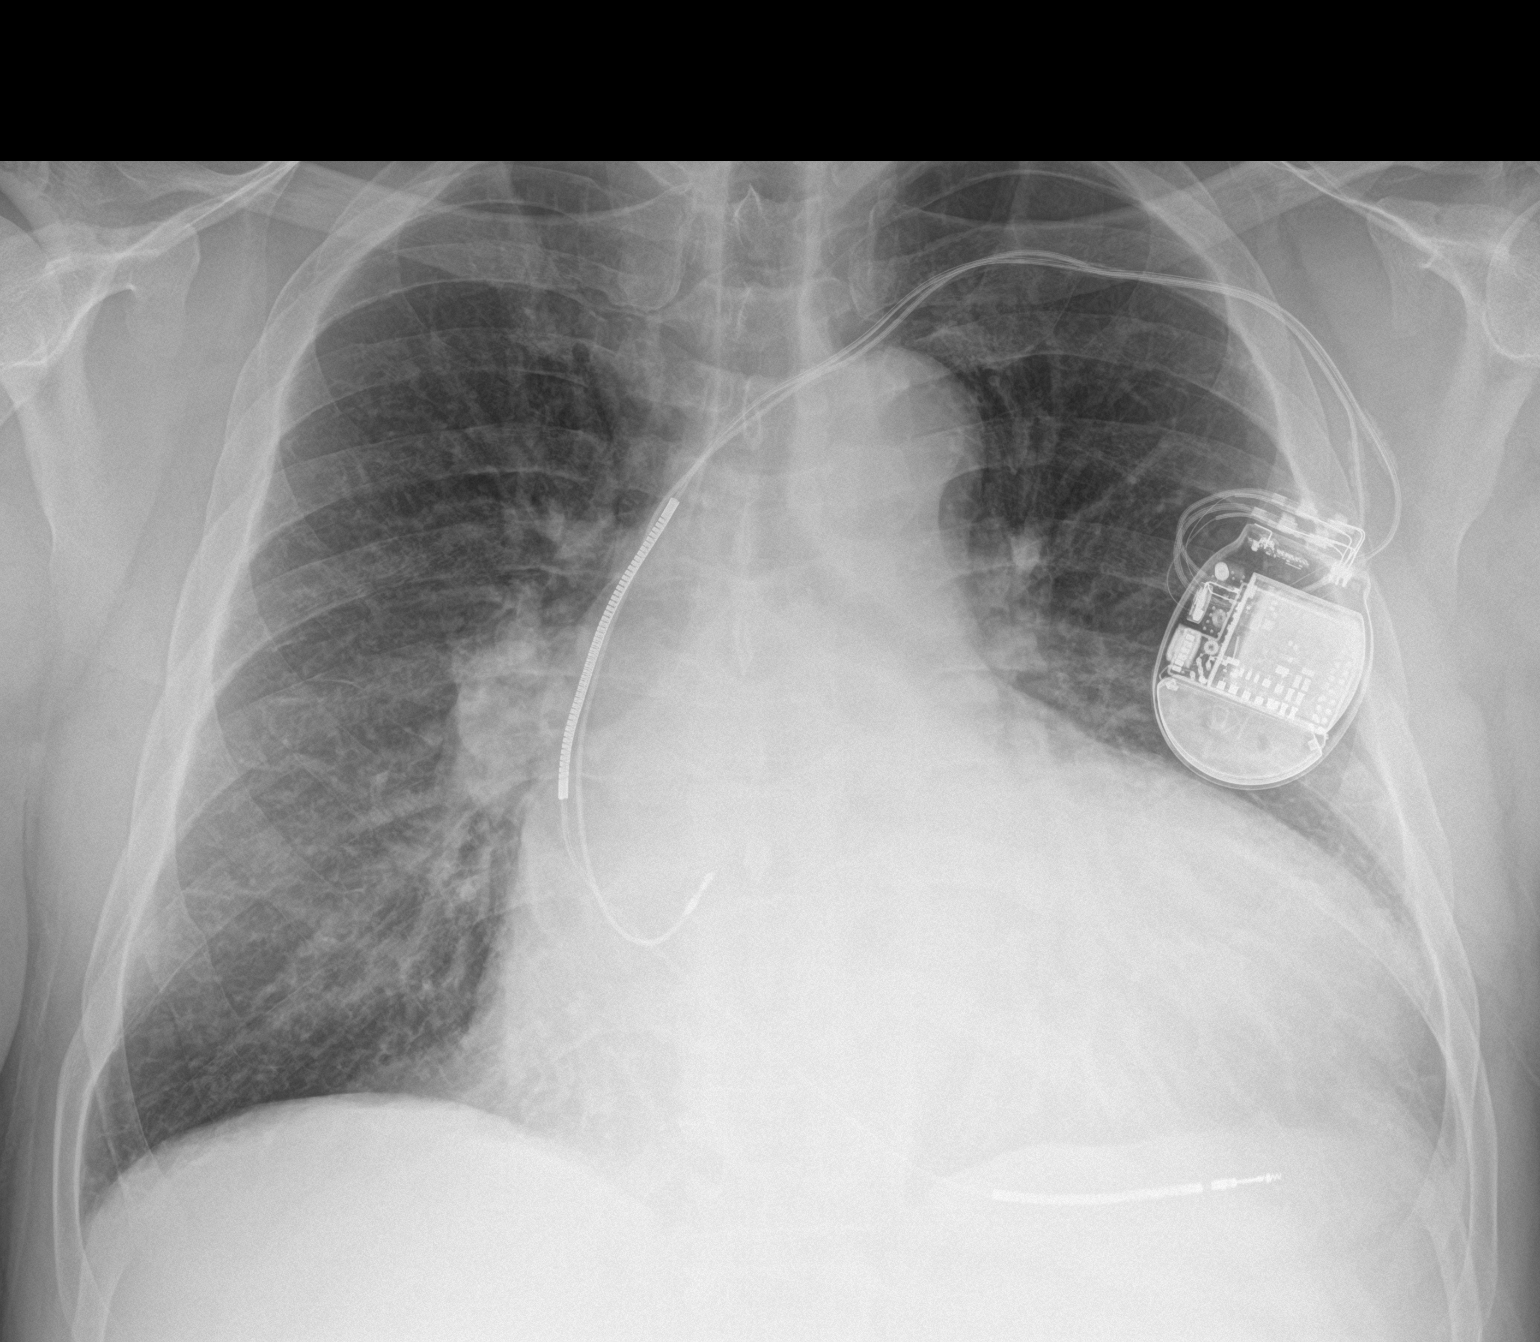

[chest lat]
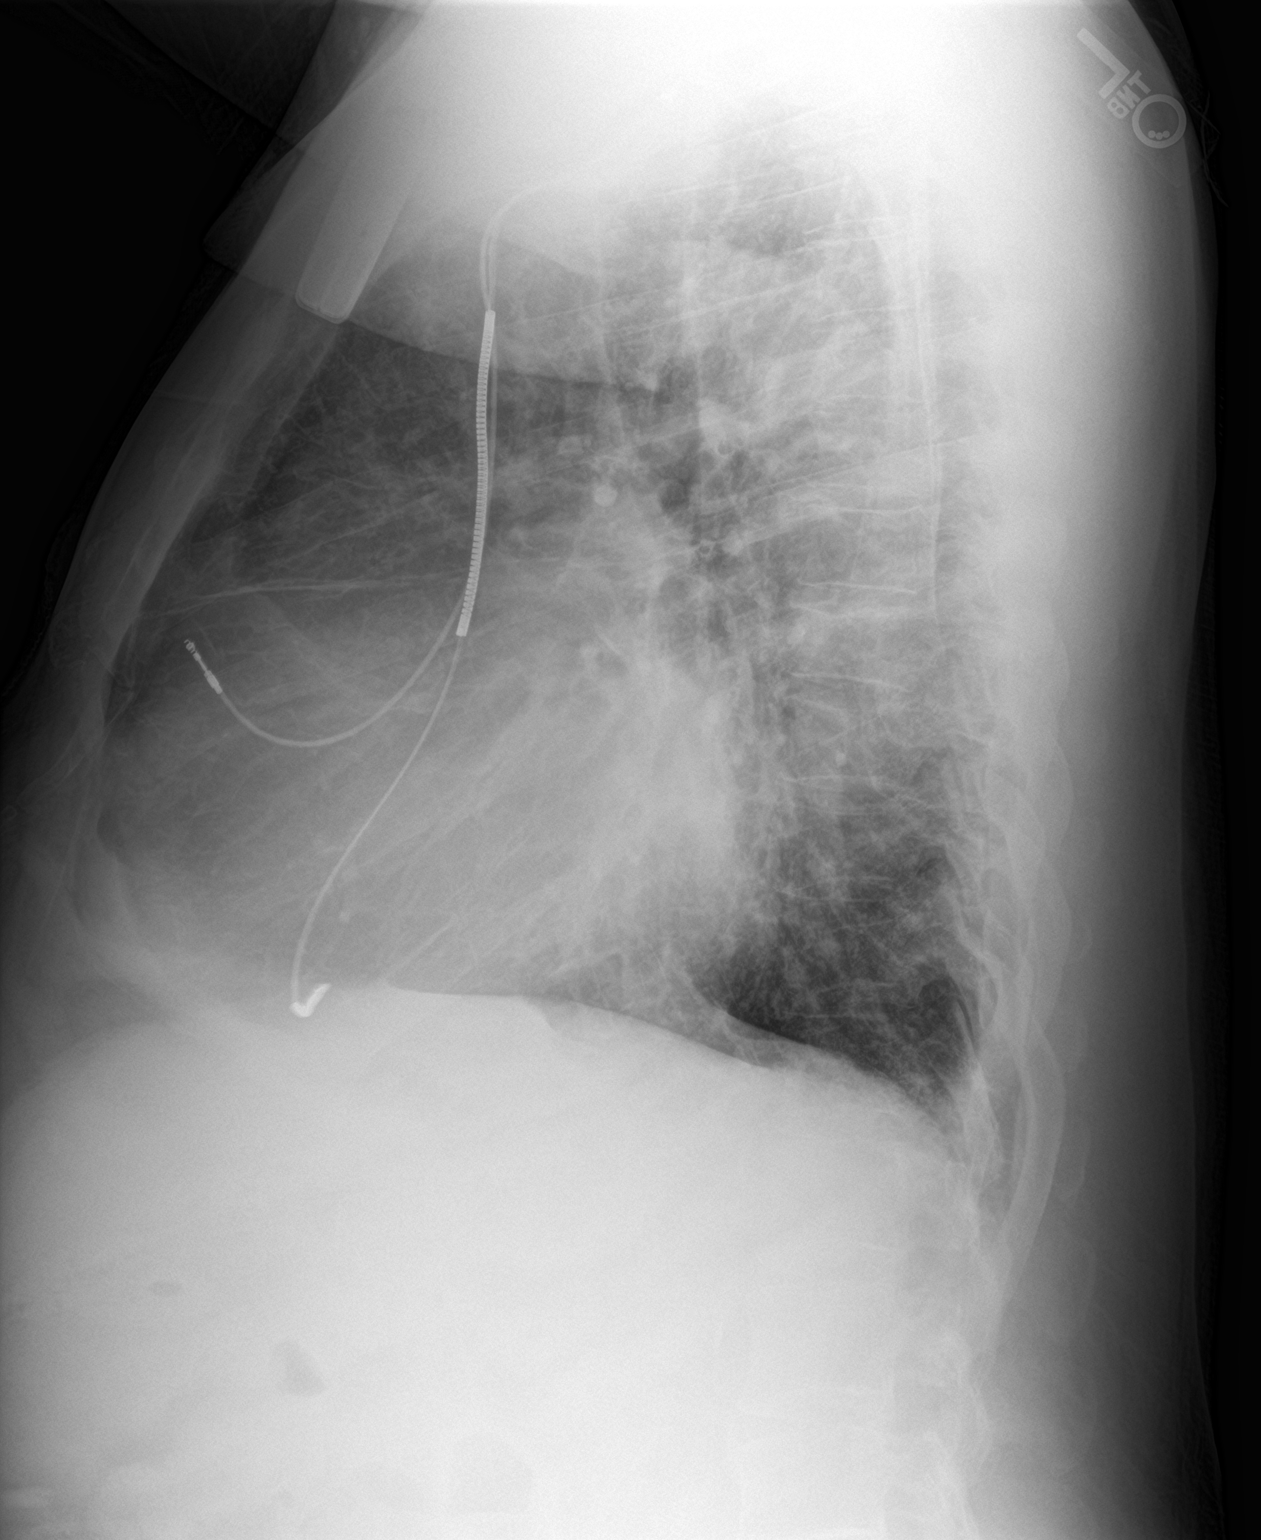

[2 of 2 positions shown; findings below may reference images not displayed]

FINDINGS: Marked cardiomegaly, increased compared to the previous study. LEFT
chest wall pacemaker/ICD apparatus in place.

Mildly prominent interstitial markings bilaterally, presumably
edema. No evidence of alveolar pulmonary edema. No confluent opacity
to suggest a consolidating pneumonia. No pleural effusion or
pneumothorax. No acute appearing osseous abnormality.
IMPRESSION: 1. Marked cardiomegaly, increased compared to the previous study of
10/23/2008.
2. Mildly prominent interstitial markings bilaterally, presumably
interstitial edema related to mild CHF/volume overload.
